# Patient Record
Sex: Female | Born: 1985 | Race: White | Hispanic: No | Marital: Married | Smoking: Never smoker
Health system: Southern US, Community
[De-identification: ages and names within clinical notes are randomized; demographics above are authoritative.]

## PROBLEM LIST (undated history)

## (undated) ENCOUNTER — Inpatient Hospital Stay (HOSPITAL_COMMUNITY): Payer: Self-pay

## (undated) DIAGNOSIS — E039 Hypothyroidism, unspecified: Secondary | ICD-10-CM

## (undated) DIAGNOSIS — Z8659 Personal history of other mental and behavioral disorders: Secondary | ICD-10-CM

## (undated) DIAGNOSIS — K5900 Constipation, unspecified: Secondary | ICD-10-CM

## (undated) DIAGNOSIS — F329 Major depressive disorder, single episode, unspecified: Secondary | ICD-10-CM

## (undated) DIAGNOSIS — Z862 Personal history of diseases of the blood and blood-forming organs and certain disorders involving the immune mechanism: Secondary | ICD-10-CM

## (undated) DIAGNOSIS — E559 Vitamin D deficiency, unspecified: Secondary | ICD-10-CM

## (undated) DIAGNOSIS — M2550 Pain in unspecified joint: Secondary | ICD-10-CM

## (undated) DIAGNOSIS — D649 Anemia, unspecified: Secondary | ICD-10-CM

## (undated) DIAGNOSIS — F429 Obsessive-compulsive disorder, unspecified: Secondary | ICD-10-CM

## (undated) DIAGNOSIS — C4490 Unspecified malignant neoplasm of skin, unspecified: Secondary | ICD-10-CM

## (undated) DIAGNOSIS — K219 Gastro-esophageal reflux disease without esophagitis: Secondary | ICD-10-CM

## (undated) DIAGNOSIS — F419 Anxiety disorder, unspecified: Secondary | ICD-10-CM

## (undated) DIAGNOSIS — Z6831 Body mass index (BMI) 31.0-31.9, adult: Secondary | ICD-10-CM

## (undated) DIAGNOSIS — G43009 Migraine without aura, not intractable, without status migrainosus: Secondary | ICD-10-CM

## (undated) DIAGNOSIS — Z91018 Allergy to other foods: Secondary | ICD-10-CM

## (undated) DIAGNOSIS — K9041 Non-celiac gluten sensitivity: Secondary | ICD-10-CM

## (undated) DIAGNOSIS — K5904 Chronic idiopathic constipation: Secondary | ICD-10-CM

## (undated) DIAGNOSIS — IMO0002 Reserved for concepts with insufficient information to code with codable children: Secondary | ICD-10-CM

## (undated) DIAGNOSIS — F32A Depression, unspecified: Secondary | ICD-10-CM

## (undated) DIAGNOSIS — M069 Rheumatoid arthritis, unspecified: Secondary | ICD-10-CM

## (undated) DIAGNOSIS — N921 Excessive and frequent menstruation with irregular cycle: Secondary | ICD-10-CM

## (undated) DIAGNOSIS — R630 Anorexia: Secondary | ICD-10-CM

## (undated) DIAGNOSIS — E063 Autoimmune thyroiditis: Secondary | ICD-10-CM

## (undated) DIAGNOSIS — F411 Generalized anxiety disorder: Secondary | ICD-10-CM

## (undated) DIAGNOSIS — L719 Rosacea, unspecified: Secondary | ICD-10-CM

## (undated) DIAGNOSIS — N920 Excessive and frequent menstruation with regular cycle: Secondary | ICD-10-CM

## (undated) HISTORY — DX: Vitamin D deficiency, unspecified: E55.9

## (undated) HISTORY — PX: WISDOM TOOTH EXTRACTION: SHX21

## (undated) HISTORY — PX: TONSILLECTOMY: SUR1361

## (undated) HISTORY — DX: Body mass index (BMI) 31.0-31.9, adult: Z68.31

## (undated) HISTORY — DX: Pain in unspecified joint: M25.50

## (undated) HISTORY — PX: SKIN BIOPSY: SHX1

## (undated) HISTORY — DX: Unspecified malignant neoplasm of skin, unspecified: C44.90

## (undated) HISTORY — DX: Anorexia: R63.0

## (undated) HISTORY — DX: Constipation, unspecified: K59.00

## (undated) HISTORY — DX: Reserved for concepts with insufficient information to code with codable children: IMO0002

## (undated) HISTORY — DX: Anemia, unspecified: D64.9

## (undated) HISTORY — DX: Allergy to other foods: Z91.018

---
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
## 2012-09-12 DIAGNOSIS — Z85820 Personal history of malignant melanoma of skin: Secondary | ICD-10-CM

## 2012-09-12 HISTORY — DX: Personal history of malignant melanoma of skin: Z85.820

## 2013-01-19 LAB — OB RESULTS CONSOLE RUBELLA ANTIBODY, IGM: Rubella: IMMUNE

## 2013-01-19 LAB — OB RESULTS CONSOLE ABO/RH

## 2013-01-19 LAB — OB RESULTS CONSOLE ANTIBODY SCREEN: Antibody Screen: NEGATIVE

## 2013-03-21 ENCOUNTER — Encounter (HOSPITAL_COMMUNITY): Payer: Self-pay

## 2013-03-21 ENCOUNTER — Inpatient Hospital Stay (HOSPITAL_COMMUNITY)
Admission: AD | Admit: 2013-03-21 | Discharge: 2013-03-21 | Disposition: A | Discharge status: Home / Self Care | Payer: BC Managed Care – PPO | Source: Ambulatory Visit | Attending: Obstetrics and Gynecology | Admitting: Obstetrics and Gynecology

## 2013-03-21 DIAGNOSIS — N949 Unspecified condition associated with female genital organs and menstrual cycle: Secondary | ICD-10-CM | POA: Insufficient documentation

## 2013-03-21 DIAGNOSIS — O99891 Other specified diseases and conditions complicating pregnancy: Secondary | ICD-10-CM | POA: Insufficient documentation

## 2013-03-21 HISTORY — DX: Anxiety disorder, unspecified: F41.9

## 2013-03-21 HISTORY — DX: Obsessive-compulsive disorder, unspecified: F42.9

## 2013-03-21 HISTORY — DX: Rheumatoid arthritis, unspecified: M06.9

## 2013-03-21 LAB — URINALYSIS, ROUTINE W REFLEX MICROSCOPIC
Leukocytes, UA: NEGATIVE
Nitrite: NEGATIVE
Specific Gravity, Urine: 1.015 (ref 1.005–1.030)
Urobilinogen, UA: 0.2 mg/dL (ref 0.0–1.0)
pH: 7.5 (ref 5.0–8.0)

## 2013-03-21 LAB — WET PREP, GENITAL
Clue Cells Wet Prep HPF POC: NONE SEEN
Trich, Wet Prep: NONE SEEN

## 2013-03-21 NOTE — MAU Provider Note (Signed)
History  Pt states last week noted a "piece of tissue," "flesh" colored when she voided 1 weeks ago and again last night in the bath. Denies pain/cramping or VB. Has u/s at 18 weeks, all wnl.    Chief Complaint  Patient presents with  . Skin cells in urine     OB History   Grav Para Term Preterm Abortions TAB SAB Ect Mult Living   1               Past Medical History  Diagnosis Date  . Anxiety   . OCD (obsessive compulsive disorder)   . Rheumatoid arthritis   . Fibromyalgia     Past Surgical History  Procedure Laterality Date  . Tonsillectomy    . Wisdom tooth extraction    . Skin biopsy      Left inner thigh    History reviewed. No pertinent family history.  History  Substance Use Topics  . Smoking status: Never Smoker   . Smokeless tobacco: Never Used  . Alcohol Use: No    Allergies:  Allergies  Allergen Reactions  . Pineapple Swelling    Lips/tongue swelling, doesn't not bother breathing  . Codeine Hives  . Darvocet (Propoxyphene-Acetaminophen) Hives  . Hydrocodone Hives  . Prednisone Hives and Rash    No prescriptions prior to admission    ROS - see HPI above, all other systems neg  Physical Exam   Blood pressure 117/62, pulse 79, temperature 97 F (36.1 C), temperature source Oral, resp. rate 18, height 5\' 4"  (1.626 m), weight 190 lb (86.183 kg).  Chest clear  Heart RRR without murmur  Abd gravid, NT  Pelvic--cervix posterior, C / soft / high;  No VB;  No tissue noted Ext WNL  FHR doppler 150   ED Course  IUP at [redacted]w[redacted]d  Wet prep - Neg UA - ketones 15 GC/CT pending   Addendum:  Pt requesting to leave before CNM could return to MAU Lab results rv'd with pt by RN RN to encourage pt to call if symptoms return or worsen.    Haroldine Laws CNM, MSN 03/21/2013 5:22 PM

## 2013-03-21 NOTE — MAU Note (Signed)
Pt states last week noted a "piece of tissue" when she voided. Denies pain or bleeding. Has u/s at 18 weeks, all wnl. Saw more tissue this week, and took bath last pm and noted more tissue. Concerned/anxious.

## 2013-03-21 NOTE — Progress Notes (Signed)
J. oxley cnm notified of wet prep results and patient desire to be discharged home. She states that she is unable to discharge patient at this time. She will come to see in MAU.

## 2013-05-17 LAB — OB RESULTS CONSOLE RPR: RPR: NONREACTIVE

## 2013-07-04 ENCOUNTER — Encounter (HOSPITAL_COMMUNITY): Payer: Self-pay | Admitting: *Deleted

## 2013-07-04 ENCOUNTER — Inpatient Hospital Stay (HOSPITAL_COMMUNITY)
Admission: AD | Admit: 2013-07-04 | Discharge: 2013-07-04 | Disposition: A | Discharge status: Home / Self Care | Payer: BC Managed Care – PPO | Source: Ambulatory Visit | Attending: Obstetrics and Gynecology | Admitting: Obstetrics and Gynecology

## 2013-07-04 DIAGNOSIS — O4700 False labor before 37 completed weeks of gestation, unspecified trimester: Secondary | ICD-10-CM | POA: Insufficient documentation

## 2013-07-04 DIAGNOSIS — Z8659 Personal history of other mental and behavioral disorders: Secondary | ICD-10-CM

## 2013-07-04 DIAGNOSIS — Z889 Allergy status to unspecified drugs, medicaments and biological substances status: Secondary | ICD-10-CM

## 2013-07-04 DIAGNOSIS — O99891 Other specified diseases and conditions complicating pregnancy: Secondary | ICD-10-CM | POA: Insufficient documentation

## 2013-07-04 DIAGNOSIS — F411 Generalized anxiety disorder: Secondary | ICD-10-CM | POA: Diagnosis not present

## 2013-07-04 DIAGNOSIS — M797 Fibromyalgia: Secondary | ICD-10-CM | POA: Diagnosis not present

## 2013-07-04 DIAGNOSIS — M069 Rheumatoid arthritis, unspecified: Secondary | ICD-10-CM | POA: Diagnosis not present

## 2013-07-04 DIAGNOSIS — F429 Obsessive-compulsive disorder, unspecified: Secondary | ICD-10-CM | POA: Diagnosis not present

## 2013-07-04 HISTORY — DX: Allergy status to unspecified drugs, medicaments and biological substances: Z88.9

## 2013-07-04 HISTORY — DX: Generalized anxiety disorder: F41.1

## 2013-07-04 HISTORY — DX: Personal history of other mental and behavioral disorders: Z86.59

## 2013-07-04 LAB — WET PREP, GENITAL
Trich, Wet Prep: NONE SEEN
Yeast Wet Prep HPF POC: NONE SEEN

## 2013-07-04 LAB — URINALYSIS, ROUTINE W REFLEX MICROSCOPIC
Bilirubin Urine: NEGATIVE
Hgb urine dipstick: NEGATIVE
Nitrite: NEGATIVE
Specific Gravity, Urine: 1.01 (ref 1.005–1.030)
pH: 6.5 (ref 5.0–8.0)

## 2013-07-04 LAB — OB RESULTS CONSOLE GC/CHLAMYDIA
Chlamydia: NEGATIVE
Gonorrhea: NEGATIVE

## 2013-07-04 NOTE — MAU Provider Note (Signed)
History   28 yo G1P0 at 8 2/7 weeks presented after calling office with c/o ? Leaking.  Some cramping today, denies bleeding, reports +FM.  Patient Active Problem List   Diagnosis Date Noted  . OCD (obsessive compulsive disorder) 07/04/2013  . Anxiety state, unspecified 07/04/2013  . Fibromyalgia 07/04/2013  . Hx of anorexia nervosa and bulimia 07/04/2013  . Rheumatoid arthritis 07/04/2013     Chief Complaint  Patient presents with  . Labor Eval  . Rupture of Membranes    OB History   Grav Para Term Preterm Abortions TAB SAB Ect Mult Living   1               Past Medical History  Diagnosis Date  . Anxiety   . OCD (obsessive compulsive disorder)   . Rheumatoid arthritis(714.0)   . Fibromyalgia     Past Surgical History  Procedure Laterality Date  . Tonsillectomy    . Wisdom tooth extraction    . Skin biopsy      Left inner thigh    History reviewed. No pertinent family history.  History  Substance Use Topics  . Smoking status: Never Smoker   . Smokeless tobacco: Never Used  . Alcohol Use: No    Allergies:  Allergies  Allergen Reactions  . Pineapple Swelling    Lips/tongue swelling, doesn't not bother breathing  . Codeine Hives  . Darvocet (Propoxyphene-Acetaminophen) Hives  . Hydrocodone Hives  . Methotrexate Derivatives Hives and Rash  . Prednisone Hives and Rash    No prescriptions prior to admission    Physical Exam   Blood pressure 120/67, pulse 104, temperature 97.9 F (36.6 C), temperature source Oral, resp. rate 18.  Chest clear Heart RRR without murmur Abd gravid, NT Pelvic--small amount thin white d/c in vault. Cervix closed and long, vtx, -2 Ext WNL  FHR Category 1 UCs irregular and sporadic, mild  Results for orders placed during the hospital encounter of 07/04/13 (from the past 24 hour(s))  URINALYSIS, ROUTINE W REFLEX MICROSCOPIC     Status: None   Collection Time    07/04/13  3:47 PM      Result Value Range   Color,  Urine YELLOW  YELLOW   APPearance CLEAR  CLEAR   Specific Gravity, Urine 1.010  1.005 - 1.030   pH 6.5  5.0 - 8.0   Glucose, UA NEGATIVE  NEGATIVE mg/dL   Hgb urine dipstick NEGATIVE  NEGATIVE   Bilirubin Urine NEGATIVE  NEGATIVE   Ketones, ur NEGATIVE  NEGATIVE mg/dL   Protein, ur NEGATIVE  NEGATIVE mg/dL   Urobilinogen, UA 0.2  0.0 - 1.0 mg/dL   Nitrite NEGATIVE  NEGATIVE   Leukocytes, UA NEGATIVE  NEGATIVE  AMNISURE RUPTURE OF MEMBRANE (ROM)     Status: None   Collection Time    07/04/13  4:25 PM      Result Value Range   Amnisure ROM NEGATIVE    WET PREP, GENITAL     Status: Abnormal   Collection Time    07/04/13  5:00 PM      Result Value Range   Yeast Wet Prep HPF POC NONE SEEN  NONE SEEN   Trich, Wet Prep NONE SEEN  NONE SEEN   Clue Cells Wet Prep HPF POC NONE SEEN  NONE SEEN   WBC, Wet Prep HPF POC FEW (*) NONE SEEN     ED Course  IUP at 34 2/7 weeks No evidence SROM or PTL D/C'd home with  PTL precautions. OOW tomorrow--encourage to push fluids and rest. Keep scheduled appt on Monday or call prn.  Nigel Bridgeman CNM, [place] 07/04/2013 9:12 PM

## 2013-07-04 NOTE — MAU Note (Signed)
Pt presents with complaints of a large gush of clear fluid at 1030 this am. States some contractions started after she felt that gush of fluid. Denies any bleeding. States baby is active.

## 2013-07-05 LAB — GC/CHLAMYDIA PROBE AMP
CT Probe RNA: NEGATIVE
GC Probe RNA: NEGATIVE

## 2013-07-07 LAB — CULTURE, BETA STREP (GROUP B ONLY)

## 2013-08-16 ENCOUNTER — Telehealth (HOSPITAL_COMMUNITY): Payer: Self-pay | Admitting: *Deleted

## 2013-08-16 ENCOUNTER — Encounter (HOSPITAL_COMMUNITY): Payer: Self-pay

## 2013-08-16 ENCOUNTER — Encounter (HOSPITAL_COMMUNITY): Payer: Self-pay | Admitting: *Deleted

## 2013-08-16 ENCOUNTER — Inpatient Hospital Stay (HOSPITAL_COMMUNITY)
Admission: RE | Admit: 2013-08-16 | Discharge: 2013-08-19 | DRG: 370 | Disposition: A | Discharge status: Home / Self Care | Payer: BC Managed Care – PPO | Source: Ambulatory Visit | Attending: Obstetrics and Gynecology | Admitting: Obstetrics and Gynecology

## 2013-08-16 DIAGNOSIS — O3660X0 Maternal care for excessive fetal growth, unspecified trimester, not applicable or unspecified: Secondary | ICD-10-CM | POA: Diagnosis present

## 2013-08-16 DIAGNOSIS — F411 Generalized anxiety disorder: Secondary | ICD-10-CM | POA: Diagnosis present

## 2013-08-16 DIAGNOSIS — O324XX0 Maternal care for high head at term, not applicable or unspecified: Secondary | ICD-10-CM | POA: Diagnosis present

## 2013-08-16 DIAGNOSIS — Z889 Allergy status to unspecified drugs, medicaments and biological substances status: Secondary | ICD-10-CM

## 2013-08-16 DIAGNOSIS — Z98891 History of uterine scar from previous surgery: Secondary | ICD-10-CM

## 2013-08-16 DIAGNOSIS — IMO0002 Reserved for concepts with insufficient information to code with codable children: Secondary | ICD-10-CM | POA: Diagnosis present

## 2013-08-16 DIAGNOSIS — D649 Anemia, unspecified: Secondary | ICD-10-CM | POA: Diagnosis present

## 2013-08-16 DIAGNOSIS — F410 Panic disorder [episodic paroxysmal anxiety] without agoraphobia: Secondary | ICD-10-CM | POA: Diagnosis present

## 2013-08-16 DIAGNOSIS — O9902 Anemia complicating childbirth: Secondary | ICD-10-CM | POA: Diagnosis present

## 2013-08-16 DIAGNOSIS — F429 Obsessive-compulsive disorder, unspecified: Secondary | ICD-10-CM | POA: Diagnosis present

## 2013-08-16 DIAGNOSIS — O480 Post-term pregnancy: Principal | ICD-10-CM | POA: Diagnosis present

## 2013-08-16 LAB — CBC
HCT: 32.6 % — ABNORMAL LOW (ref 36.0–46.0)
Hemoglobin: 10.8 g/dL — ABNORMAL LOW (ref 12.0–15.0)
RDW: 15 % (ref 11.5–15.5)
WBC: 10.3 10*3/uL (ref 4.0–10.5)

## 2013-08-16 MED ORDER — TERBUTALINE SULFATE 1 MG/ML IJ SOLN: 0.2500 mg | Freq: Once | INTRAMUSCULAR | Status: AC | PRN

## 2013-08-16 MED ORDER — SERTRALINE HCL 25 MG PO TABS
25.0000 mg | ORAL_TABLET | Freq: Every day | ORAL | Status: DC
  Administered 2013-08-16 – 2013-08-18 (×3): 25 mg via ORAL
  Filled 2013-08-16 (×4): qty 1

## 2013-08-16 MED ORDER — LACTATED RINGERS IV SOLN
INTRAVENOUS | Status: DC
  Administered 2013-08-17: 08:00:00 via INTRAVENOUS

## 2013-08-16 MED ORDER — OXYTOCIN BOLUS FROM INFUSION: 500.0000 mL | INTRAVENOUS | Status: DC

## 2013-08-16 MED ORDER — LIDOCAINE HCL (PF) 1 % IJ SOLN
30.0000 mL | INTRAMUSCULAR | Status: DC | PRN
  Filled 2013-08-16: qty 30

## 2013-08-16 MED ORDER — LACTATED RINGERS IV SOLN: 500.0000 mL | INTRAVENOUS | Status: DC | PRN

## 2013-08-16 MED ORDER — MISOPROSTOL 25 MCG QUARTER TABLET
25.0000 ug | ORAL_TABLET | ORAL | Status: DC | PRN
  Administered 2013-08-16: 25 ug via VAGINAL
  Filled 2013-08-16: qty 0.25

## 2013-08-16 MED ORDER — OXYTOCIN 40 UNITS IN LACTATED RINGERS INFUSION - SIMPLE MED
62.5000 mL/h | INTRAVENOUS | Status: DC
  Filled 2013-08-16: qty 1000

## 2013-08-16 MED ORDER — IBUPROFEN 600 MG PO TABS: 600.0000 mg | ORAL_TABLET | Freq: Four times a day (QID) | ORAL | Status: DC | PRN

## 2013-08-16 MED ORDER — CITRIC ACID-SODIUM CITRATE 334-500 MG/5ML PO SOLN
30.0000 mL | ORAL | Status: DC | PRN
  Administered 2013-08-17: 30 mL via ORAL
  Filled 2013-08-16: qty 15

## 2013-08-16 MED ORDER — ONDANSETRON HCL 4 MG/2ML IJ SOLN: 4.0000 mg | Freq: Four times a day (QID) | INTRAMUSCULAR | Status: DC | PRN

## 2013-08-16 NOTE — Telephone Encounter (Signed)
Preadmission screen  
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
## 2013-08-17 ENCOUNTER — Encounter (HOSPITAL_COMMUNITY): Payer: Self-pay | Admitting: Anesthesiology

## 2013-08-17 ENCOUNTER — Encounter (HOSPITAL_COMMUNITY): Payer: Self-pay

## 2013-08-17 ENCOUNTER — Inpatient Hospital Stay (HOSPITAL_COMMUNITY): Admission: RE | Admit: 2013-08-17 | Payer: BC Managed Care – PPO | Source: Ambulatory Visit

## 2013-08-17 ENCOUNTER — Inpatient Hospital Stay (HOSPITAL_COMMUNITY): Payer: BC Managed Care – PPO | Admitting: Anesthesiology

## 2013-08-17 ENCOUNTER — Encounter (HOSPITAL_COMMUNITY)
Admission: RE | Disposition: A | Discharge status: Home / Self Care | Payer: Self-pay | Source: Ambulatory Visit | Attending: Obstetrics and Gynecology

## 2013-08-17 DIAGNOSIS — D649 Anemia, unspecified: Secondary | ICD-10-CM | POA: Diagnosis present

## 2013-08-17 LAB — TYPE AND SCREEN
ABO/RH(D): A POS
Antibody Screen: NEGATIVE

## 2013-08-17 LAB — ABO/RH: ABO/RH(D): A POS

## 2013-08-17 LAB — RPR: RPR Ser Ql: NONREACTIVE

## 2013-08-17 SURGERY — Surgical Case
Anesthesia: Epidural | Site: Abdomen | Wound class: Clean Contaminated

## 2013-08-17 MED ORDER — LANOLIN HYDROUS EX OINT: 1.0000 "application " | TOPICAL_OINTMENT | CUTANEOUS | Status: DC | PRN

## 2013-08-17 MED ORDER — SIMETHICONE 80 MG PO CHEW
80.0000 mg | CHEWABLE_TABLET | Freq: Three times a day (TID) | ORAL | Status: DC
  Administered 2013-08-17 – 2013-08-19 (×6): 80 mg via ORAL

## 2013-08-17 MED ORDER — MORPHINE SULFATE 0.5 MG/ML IJ SOLN
INTRAMUSCULAR | Status: AC
  Filled 2013-08-17: qty 10

## 2013-08-17 MED ORDER — ZOLPIDEM TARTRATE 5 MG PO TABS: 5.0000 mg | ORAL_TABLET | Freq: Every evening | ORAL | Status: DC | PRN

## 2013-08-17 MED ORDER — ONDANSETRON HCL 4 MG/2ML IJ SOLN
INTRAMUSCULAR | Status: AC
  Filled 2013-08-17: qty 2

## 2013-08-17 MED ORDER — ONDANSETRON HCL 4 MG/2ML IJ SOLN: 4.0000 mg | Freq: Three times a day (TID) | INTRAMUSCULAR | Status: DC | PRN

## 2013-08-17 MED ORDER — HYDROMORPHONE HCL 2 MG PO TABS: 2.0000 mg | ORAL_TABLET | ORAL | Status: DC | PRN

## 2013-08-17 MED ORDER — FENTANYL 2.5 MCG/ML BUPIVACAINE 1/10 % EPIDURAL INFUSION (WH - ANES)
14.0000 mL/h | INTRAMUSCULAR | Status: DC | PRN
  Administered 2013-08-17 (×2): 14 mL/h via EPIDURAL
  Filled 2013-08-17 (×2): qty 125

## 2013-08-17 MED ORDER — CEFAZOLIN SODIUM-DEXTROSE 2-3 GM-% IV SOLR
INTRAVENOUS | Status: DC | PRN
  Administered 2013-08-17: 2 g via INTRAVENOUS

## 2013-08-17 MED ORDER — BUPIVACAINE HCL (PF) 0.25 % IJ SOLN
INTRAMUSCULAR | Status: DC | PRN
  Administered 2013-08-17: 20 mL
  Administered 2013-08-17: 10 mL

## 2013-08-17 MED ORDER — PHENYLEPHRINE 40 MCG/ML (10ML) SYRINGE FOR IV PUSH (FOR BLOOD PRESSURE SUPPORT): 80.0000 ug | PREFILLED_SYRINGE | INTRAVENOUS | Status: DC | PRN

## 2013-08-17 MED ORDER — PHENYLEPHRINE HCL 10 MG/ML IJ SOLN
INTRAMUSCULAR | Status: DC | PRN
  Administered 2013-08-17 (×2): 40 ug via INTRAVENOUS

## 2013-08-17 MED ORDER — ONDANSETRON HCL 4 MG/2ML IJ SOLN
INTRAMUSCULAR | Status: DC | PRN
  Administered 2013-08-17: 4 mg via INTRAVENOUS

## 2013-08-17 MED ORDER — SCOPOLAMINE 1 MG/3DAYS TD PT72
MEDICATED_PATCH | TRANSDERMAL | Status: AC
  Administered 2013-08-17: 1.5 mg via TRANSDERMAL
  Filled 2013-08-17: qty 1

## 2013-08-17 MED ORDER — DIPHENHYDRAMINE HCL 25 MG PO CAPS: 25.0000 mg | ORAL_CAPSULE | ORAL | Status: DC | PRN

## 2013-08-17 MED ORDER — IBUPROFEN 600 MG PO TABS
600.0000 mg | ORAL_TABLET | Freq: Four times a day (QID) | ORAL | Status: DC
  Administered 2013-08-18 – 2013-08-19 (×7): 600 mg via ORAL
  Filled 2013-08-17 (×6): qty 1

## 2013-08-17 MED ORDER — LACTATED RINGERS IV SOLN
500.0000 mL | Freq: Once | INTRAVENOUS | Status: AC
  Administered 2013-08-17: 500 mL via INTRAVENOUS

## 2013-08-17 MED ORDER — NALOXONE HCL 1 MG/ML IJ SOLN: 1.0000 ug/kg/h | INTRAVENOUS | Status: DC | PRN

## 2013-08-17 MED ORDER — LIDOCAINE HCL (PF) 1 % IJ SOLN
INTRAMUSCULAR | Status: DC | PRN
  Administered 2013-08-17 (×2): 5 mL

## 2013-08-17 MED ORDER — PHENYLEPHRINE 40 MCG/ML (10ML) SYRINGE FOR IV PUSH (FOR BLOOD PRESSURE SUPPORT)
80.0000 ug | PREFILLED_SYRINGE | INTRAVENOUS | Status: DC | PRN
  Filled 2013-08-17: qty 5

## 2013-08-17 MED ORDER — MENTHOL 3 MG MT LOZG: 1.0000 | LOZENGE | OROMUCOSAL | Status: DC | PRN

## 2013-08-17 MED ORDER — DIPHENHYDRAMINE HCL 50 MG/ML IJ SOLN: 12.5000 mg | INTRAMUSCULAR | Status: DC | PRN

## 2013-08-17 MED ORDER — DIBUCAINE 1 % RE OINT: 1.0000 "application " | TOPICAL_OINTMENT | RECTAL | Status: DC | PRN

## 2013-08-17 MED ORDER — NALOXONE HCL 0.4 MG/ML IJ SOLN: 0.4000 mg | INTRAMUSCULAR | Status: DC | PRN

## 2013-08-17 MED ORDER — EPHEDRINE 5 MG/ML INJ
10.0000 mg | INTRAVENOUS | Status: DC | PRN
  Filled 2013-08-17: qty 4

## 2013-08-17 MED ORDER — MORPHINE SULFATE (PF) 0.5 MG/ML IJ SOLN
INTRAMUSCULAR | Status: DC | PRN
  Administered 2013-08-17: 4 mg via EPIDURAL

## 2013-08-17 MED ORDER — LACTATED RINGERS IV SOLN
INTRAVENOUS | Status: DC | PRN
  Administered 2013-08-17: 13:00:00 via INTRAVENOUS

## 2013-08-17 MED ORDER — LIDOCAINE-EPINEPHRINE (PF) 2 %-1:200000 IJ SOLN
INTRAMUSCULAR | Status: AC
  Filled 2013-08-17: qty 20

## 2013-08-17 MED ORDER — DIPHENHYDRAMINE HCL 25 MG PO CAPS: 25.0000 mg | ORAL_CAPSULE | Freq: Four times a day (QID) | ORAL | Status: DC | PRN

## 2013-08-17 MED ORDER — OXYTOCIN 40 UNITS IN LACTATED RINGERS INFUSION - SIMPLE MED: 62.5000 mL/h | INTRAVENOUS | Status: AC

## 2013-08-17 MED ORDER — OXYTOCIN 10 UNIT/ML IJ SOLN
INTRAMUSCULAR | Status: AC
  Filled 2013-08-17: qty 4

## 2013-08-17 MED ORDER — SODIUM BICARBONATE 8.4 % IV SOLN
INTRAVENOUS | Status: AC
  Filled 2013-08-17: qty 50

## 2013-08-17 MED ORDER — MEPERIDINE HCL 25 MG/ML IJ SOLN
INTRAMUSCULAR | Status: AC
  Administered 2013-08-17: 6.25 mg via INTRAVENOUS
  Filled 2013-08-17: qty 1

## 2013-08-17 MED ORDER — EPHEDRINE 5 MG/ML INJ: 10.0000 mg | INTRAVENOUS | Status: DC | PRN

## 2013-08-17 MED ORDER — TETANUS-DIPHTH-ACELL PERTUSSIS 5-2.5-18.5 LF-MCG/0.5 IM SUSP: 0.5000 mL | Freq: Once | INTRAMUSCULAR | Status: DC

## 2013-08-17 MED ORDER — KETOROLAC TROMETHAMINE 30 MG/ML IJ SOLN: 30.0000 mg | Freq: Four times a day (QID) | INTRAMUSCULAR | Status: DC | PRN

## 2013-08-17 MED ORDER — ONDANSETRON HCL 4 MG/2ML IJ SOLN: 4.0000 mg | INTRAMUSCULAR | Status: DC | PRN

## 2013-08-17 MED ORDER — PRENATAL MULTIVITAMIN CH
1.0000 | ORAL_TABLET | Freq: Every day | ORAL | Status: DC
  Administered 2013-08-18 – 2013-08-19 (×2): 1 via ORAL
  Filled 2013-08-17 (×2): qty 1

## 2013-08-17 MED ORDER — KETOROLAC TROMETHAMINE 30 MG/ML IJ SOLN
INTRAMUSCULAR | Status: AC
  Administered 2013-08-17: 30 mg via INTRAMUSCULAR
  Filled 2013-08-17: qty 1

## 2013-08-17 MED ORDER — SIMETHICONE 80 MG PO CHEW: 80.0000 mg | CHEWABLE_TABLET | ORAL | Status: DC | PRN

## 2013-08-17 MED ORDER — MEPERIDINE HCL 25 MG/ML IJ SOLN
6.2500 mg | INTRAMUSCULAR | Status: DC | PRN
  Administered 2013-08-17: 6.25 mg via INTRAVENOUS

## 2013-08-17 MED ORDER — LACTATED RINGERS IV SOLN
INTRAVENOUS | Status: DC
  Administered 2013-08-18: 02:00:00 via INTRAVENOUS

## 2013-08-17 MED ORDER — FENTANYL CITRATE 0.05 MG/ML IJ SOLN
100.0000 ug | INTRAMUSCULAR | Status: DC | PRN
  Administered 2013-08-17: 100 ug via INTRAVENOUS
  Filled 2013-08-17: qty 2

## 2013-08-17 MED ORDER — METOCLOPRAMIDE HCL 5 MG/ML IJ SOLN: 10.0000 mg | Freq: Three times a day (TID) | INTRAMUSCULAR | Status: DC | PRN

## 2013-08-17 MED ORDER — MEDROXYPROGESTERONE ACETATE 150 MG/ML IM SUSP: 150.0000 mg | INTRAMUSCULAR | Status: DC | PRN

## 2013-08-17 MED ORDER — DIPHENHYDRAMINE HCL 50 MG/ML IJ SOLN: 25.0000 mg | INTRAMUSCULAR | Status: DC | PRN

## 2013-08-17 MED ORDER — OXYTOCIN 10 UNIT/ML IJ SOLN
40.0000 [IU] | INTRAVENOUS | Status: DC | PRN
  Administered 2013-08-17: 40 [IU] via INTRAVENOUS

## 2013-08-17 MED ORDER — SCOPOLAMINE 1 MG/3DAYS TD PT72
1.0000 | MEDICATED_PATCH | Freq: Once | TRANSDERMAL | Status: DC
  Administered 2013-08-17: 1.5 mg via TRANSDERMAL

## 2013-08-17 MED ORDER — FERROUS SULFATE 325 (65 FE) MG PO TABS
325.0000 mg | ORAL_TABLET | Freq: Two times a day (BID) | ORAL | Status: DC
  Administered 2013-08-18: 325 mg via ORAL
  Filled 2013-08-17 (×3): qty 1

## 2013-08-17 MED ORDER — KETOROLAC TROMETHAMINE 30 MG/ML IJ SOLN
30.0000 mg | Freq: Four times a day (QID) | INTRAMUSCULAR | Status: DC | PRN
  Administered 2013-08-17: 30 mg via INTRAMUSCULAR

## 2013-08-17 MED ORDER — FENTANYL CITRATE 0.05 MG/ML IJ SOLN: 25.0000 ug | INTRAMUSCULAR | Status: DC | PRN

## 2013-08-17 MED ORDER — CEFAZOLIN SODIUM-DEXTROSE 2-3 GM-% IV SOLR
INTRAVENOUS | Status: AC
  Filled 2013-08-17: qty 50

## 2013-08-17 MED ORDER — ONDANSETRON HCL 4 MG PO TABS: 4.0000 mg | ORAL_TABLET | ORAL | Status: DC | PRN

## 2013-08-17 MED ORDER — SODIUM CHLORIDE 0.9 % IJ SOLN: 3.0000 mL | INTRAMUSCULAR | Status: DC | PRN

## 2013-08-17 MED ORDER — WITCH HAZEL-GLYCERIN EX PADS: 1.0000 "application " | MEDICATED_PAD | CUTANEOUS | Status: DC | PRN

## 2013-08-17 MED ORDER — SODIUM BICARBONATE 8.4 % IV SOLN
INTRAVENOUS | Status: DC | PRN
  Administered 2013-08-17: 4 mL via EPIDURAL
  Administered 2013-08-17 (×2): 5 mL via EPIDURAL

## 2013-08-17 MED ORDER — BUPIVACAINE HCL (PF) 0.25 % IJ SOLN
INTRAMUSCULAR | Status: AC
  Filled 2013-08-17: qty 30

## 2013-08-17 MED ORDER — SENNOSIDES-DOCUSATE SODIUM 8.6-50 MG PO TABS
2.0000 | ORAL_TABLET | Freq: Every day | ORAL | Status: DC
  Administered 2013-08-17 – 2013-08-18 (×2): 2 via ORAL

## 2013-08-17 SURGICAL SUPPLY — 45 items
BENZOIN TINCTURE PRP APPL 2/3 (GAUZE/BANDAGES/DRESSINGS) IMPLANT
BLADE EXTENDED COATED 6.5IN (ELECTRODE) IMPLANT
BOOTIES KNEE HIGH SLOAN (MISCELLANEOUS) ×4 IMPLANT
CLAMP CORD UMBIL (MISCELLANEOUS) IMPLANT
CLOTH BEACON ORANGE TIMEOUT ST (SAFETY) ×2 IMPLANT
CONTAINER PREFILL 10% NBF 15ML (MISCELLANEOUS) ×4 IMPLANT
DERMABOND ADVANCED (GAUZE/BANDAGES/DRESSINGS)
DERMABOND ADVANCED .7 DNX12 (GAUZE/BANDAGES/DRESSINGS) IMPLANT
DRAIN JACKSON PRT FLT 7MM (DRAIN) IMPLANT
DRAPE LG THREE QUARTER DISP (DRAPES) ×2 IMPLANT
DRSG OPSITE POSTOP 4X10 (GAUZE/BANDAGES/DRESSINGS) ×2 IMPLANT
DURAPREP 26ML APPLICATOR (WOUND CARE) ×2 IMPLANT
ELECT CAUTERY BLADE 6.4 (BLADE) IMPLANT
ELECT REM PT RETURN 9FT ADLT (ELECTROSURGICAL) ×2
ELECTRODE REM PT RTRN 9FT ADLT (ELECTROSURGICAL) ×1 IMPLANT
EVACUATOR SILICONE 100CC (DRAIN) IMPLANT
EXTRACTOR VACUUM KIWI (MISCELLANEOUS) IMPLANT
EXTRACTOR VACUUM M CUP 4 TUBE (SUCTIONS) IMPLANT
GLOVE SURG SS PI 6.5 STRL IVOR (GLOVE) ×4 IMPLANT
GOWN PREVENTION PLUS XLARGE (GOWN DISPOSABLE) ×2 IMPLANT
GOWN STRL REIN XL XLG (GOWN DISPOSABLE) ×2 IMPLANT
KIT ABG SYR 3ML LUER SLIP (SYRINGE) IMPLANT
NEEDLE HYPO 25X5/8 SAFETYGLIDE (NEEDLE) ×2 IMPLANT
NEEDLE SPNL 22GX3.5 QUINCKE BK (NEEDLE) ×2 IMPLANT
NS IRRIG 1000ML POUR BTL (IV SOLUTION) ×2 IMPLANT
PACK C SECTION WH (CUSTOM PROCEDURE TRAY) ×2 IMPLANT
PAD OB MATERNITY 4.3X12.25 (PERSONAL CARE ITEMS) ×2 IMPLANT
RETRACTOR WND ALEXIS 25 LRG (MISCELLANEOUS) ×1 IMPLANT
RTRCTR WOUND ALEXIS 25CM LRG (MISCELLANEOUS) ×2
STRIP CLOSURE SKIN 1/4X4 (GAUZE/BANDAGES/DRESSINGS) IMPLANT
SUT CHROMIC 2 0 SH (SUTURE) ×2 IMPLANT
SUT MNCRL AB 3-0 PS2 27 (SUTURE) ×2 IMPLANT
SUT SILK 0 FSL (SUTURE) IMPLANT
SUT VIC AB 0 CT1 27 (SUTURE) ×2
SUT VIC AB 0 CT1 27XBRD ANBCTR (SUTURE) ×2 IMPLANT
SUT VIC AB 0 CT1 36 (SUTURE) IMPLANT
SUT VIC AB 0 CTXB 36 (SUTURE) IMPLANT
SUT VIC AB 2-0 CT1 27 (SUTURE) ×2
SUT VIC AB 2-0 CT1 TAPERPNT 27 (SUTURE) ×2 IMPLANT
SUT VIC AB 2-0 SH 27 (SUTURE)
SUT VIC AB 2-0 SH 27XBRD (SUTURE) IMPLANT
SYR CONTROL 10ML LL (SYRINGE) ×2 IMPLANT
TOWEL OR 17X24 6PK STRL BLUE (TOWEL DISPOSABLE) ×2 IMPLANT
TRAY FOLEY CATH 14FR (SET/KITS/TRAYS/PACK) IMPLANT
WATER STERILE IRR 1000ML POUR (IV SOLUTION) IMPLANT

## 2013-08-17 NOTE — Progress Notes (Addendum)
  Subjective: Called to BS, because pt wants epidural but wants to know that she is changing her cervix first.  Pt sitting up at side of bed, breathing through UCs.   Objective: BP 117/83  Pulse 95  Temp(Src) 98.4 F (36.9 C) (Axillary)  Resp 20  Ht 5\' 4"  (1.626 m)  Wt 210 lb (95.255 kg)  BMI 36.03 kg/m2  SpO2 99%      FHT:  Cat I UC:   regular, every 2-4 minutes  SVE:   Dilation: 3 Effacement (%): 90 Station: -2 Exam by:: Alison Kelly CNM  Assessment / Plan:  Labor: Active labor after 1 dose of Cytotec at 2131 Preeclampsia: no s/s Fetal Wellbeing: Cat I Pain Control: 1 dose of Fentanyl at 0211; Requesting epidural I/D: GBS neg; intact; afibrile Anticipated MOD: SVD   Alison Kelly 08/17/2013, 4:08 AM

## 2013-08-17 NOTE — Progress Notes (Signed)
Alison Kelly is a 27 y.o. G1P0 at [redacted]w[redacted]d by LMP admitted for induction of labor due to Post dates. Due date 08/09/13 per office records. Pt was also noted to have [id number] GM.  Subjective:   Pt requesting cesarean section.  She has been completely dilated for 3.5 hours.  She has pushed for almost 3 hours. She declines attempt at vacuum assisted vaginal delivery   Objective: BP 121/72  Pulse 93  Temp(Src) 98.2 F (36.8 C) (Oral)  Resp 20  Ht 5\' 4"  (1.626 m)  Wt 210 lb (95.255 kg)  BMI 36.03 kg/m2  SpO2 100%   Total I/O In: -  Out: 250 [Urine:250]  FHT:  FHR: 120-130 bpm, variability: moderate,  accelerations:  Present,  decelerations:  Present variables UC:   regular, every 2 minutes SVE:   Dilation: 10 Effacement (%): 100 Station: +2 Exam by:: Laurel Dimmer MD  Labs: Lab Results  Component Value Date   WBC 10.3 08/16/2013   HGB 10.8* 08/16/2013   HCT 32.6* 08/16/2013   MCV 84.0 08/16/2013   PLT 207 08/16/2013    Assessment / Plan: Arrest of decent  Labor: Adequate contractions Preeclampsia:  no signs or symptoms of toxicity Fetal Wellbeing:  Category I Pain Control:  Epidural I/D:  n/a Anticipated MOD:  Pt requestst cesarean. the options for vaginal delivery, including continued pushing and vacuum assisted vaginal delivery were declined. The indication of prolonged second stage for cesarean section was reviewed. The benefit of rapid delivery was reviewed as were the risks of anesthesia, bleeding, infection, damage to adjacent organs, and possible need for subsequent cesarean sections in subsequent pregnancies. The patient expressed a desire to proceed with cesarean section.  Alison Kelly P 08/17/2013, 12:57 PM

## 2013-08-17 NOTE — Anesthesia Procedure Notes (Signed)
Epidural Patient location during procedure: OB Start time: 08/17/2013 4:29 AM  Staffing Anesthesiologist: Angus Seller., Harrell Gave. Performed by: anesthesiologist   Preanesthetic Checklist Completed: patient identified, site marked, surgical consent, pre-op evaluation, timeout performed, IV checked, risks and benefits discussed and monitors and equipment checked  Epidural Patient position: sitting Prep: site prepped and draped and DuraPrep Patient monitoring: continuous pulse ox and blood pressure Approach: midline Injection technique: LOR air and LOR saline  Needle:  Needle type: Tuohy  Needle gauge: 17 G Needle length: 9 cm and 9 Needle insertion depth: 5 cm cm Catheter type: closed end flexible Catheter size: 19 Gauge Catheter at skin depth: 10 cm Test dose: negative  Assessment Events: blood not aspirated, injection not painful, no injection resistance, negative IV test and no paresthesia  Additional Notes Patient identified.  Risk benefits discussed including failed block, incomplete pain control, headache, nerve damage, paralysis, blood pressure changes, nausea, vomiting, reactions to medication both toxic or allergic, and postpartum back pain.  Patient expressed understanding and wished to proceed.  All questions were answered.  Sterile technique used throughout procedure and epidural site dressed with sterile barrier dressing. No paresthesia or other complications noted.The patient did not experience any signs of intravascular injection such as tinnitus or metallic taste in mouth nor signs of intrathecal spread such as rapid motor block. Please see nursing notes for vital signs.

## 2013-08-17 NOTE — Anesthesia Postprocedure Evaluation (Signed)
  Anesthesia Post-op Note  Patient: Alison Kelly  Procedure(s) Performed: Procedure(s): CESAREAN SECTION (N/A)  Patient Location: Mother/Baby  Anesthesia Type:Epidural  Level of Consciousness: awake, alert  and oriented  Airway and Oxygen Therapy: Patient Spontanous Breathing  Post-op Pain: mild  Post-op Assessment: Post-op Vital signs reviewed, Patient's Cardiovascular Status Stable, No headache, No backache, No residual numbness and No residual motor weakness  Post-op Vital Signs: Reviewed and stable  Complications: No apparent anesthesia complications

## 2013-08-17 NOTE — Op Note (Signed)
Cesarean Section Procedure Note  Indications: failure to progress: arrest of descent  Pre-operative Diagnosis: 41 week 1 day pregnancy.                                               Concern for fetal macrosomia with an estimated fetal weight of 4300 g                                               Failure to progress with arrest of descent   Post-operative Diagnosis: same  Surgeon: Hal Morales  First Assistant:  Surgeon: Hal Morales   Assistants: None  Anesthesia: Epidural anesthesia  ASA Class: 2  Procedure Details  The patient was seen in the Holding Room. The risks, benefits, complications, treatment options, and expected outcomes were discussed with the patient.  The patient concurred with the proposed plan, giving informed consent.  The site of surgery properly noted/marked. The patient was taken to Operating Room # 2, identified as Alison Kelly and the procedure verified as C-Section Delivery. A Time Out was held and the above information confirmed.  After induction of anesthesia, the abdomen  was  prepped with chlor prep  in the usual sterile manner.A foley catheter was in place from labor.  The patient was then draped in the usual fashion.   Suprapubicsubcutaneous injection of 0.25% Bupivacaine   A Pfannenstiel incision was made and carried down through the subcutaneous tissue to the fascia. Fascial incision was made and extended transversely. The fascia was separated from the underlying rectus tissue superiorly and inferiorly. The peritoneum was identified and entered. Peritoneal incision was extended longitudinally.  an Teacher, early years/pre  was placed.   A low transverse uterine incision was made two cm above the uterovesical fold, and that incision extended transversely bluntly.The infant was  delivered from occiput anterior  presentation was a  living female infant with Apgar scores of 9 at one minute and 9 at five minutes. After the umbilical cord was clamped and  cut, the  placenta was removed intact and appeared normal. it was handed off to the representatives of [facility]  cord blood Bank   The uterine outline, tubes and ovaries appeared norma for the gravid state. The uterine incision was closed with running locked sutures of 0 Vicryl. An imbricating layer of sutures was placed. Hemostasis was observed. Lavage was carried out until clear. The peritoneum was closed with a running suture of 2-0 Vicryl.  The rectus muscles were reapproximated with a figure of 8 suture of 2-0 Vicryl.  The fascia was then reapproximated with a running sutures of 0 Vicryl .Renforcing figure of 8 sutures of 0Vicryl were placed on either side of midline.   The skin was reapproximated with3-0 moncryl.  A sterile dressing was applied. The infant remained in the operating room to begin skin to skin bonding.   Instrument, sponge, and needle counts were correct prior to the abdominal closure and at the conclusion of the case.   Findings:  Placenta contained a 3vessel cord    Estimated Blood Loss:  500cc         Drains:  none          Total IV Fluids:   Specimens:  placenta         Implants:  none          Complications: ::"None; patient tolerated the procedure well."         Disposition: PACU - hemodynamically stable.         Condition: stable  Attending Attestation: I performed the procedure.  HAYGOOD,VANESSA P  08/17/2013 2:15 PM

## 2013-08-17 NOTE — Progress Notes (Signed)
Attempt to in/out cath unsuccessful due to low fetal station - will attempt bed pan void CHayes, RNC

## 2013-08-17 NOTE — Progress Notes (Addendum)
  Subjective: Pt reporting mild UCs have started.  Discussed holding off on the next dose of Cytotec to see if her body is in labor on its own.  Pt agreeable.  Objective: BP 117/83  Pulse 95  Temp(Src) 98.4 F (36.9 C) (Axillary)  Resp 20  Ht 5\' 4"  (1.626 m)  Wt 210 lb (95.255 kg)  BMI 36.03 kg/m2  SpO2 99%      FHT:  Cat I UC:   regular, every 1-6 minutes  SVE:   0.5 / 80 / -2  Assessment / Plan:  Labor: ? Early labor; 1 dose of Cytotec at 2131 Preeclampsia: No s/s Fetal Wellbeing: Cat I Pain Control: Breathing and relaxation I/D: GBS neg; intact; afibrile Anticipated MOD: SVD   Alison Kelly 08/17/2013, 4:17 AM

## 2013-08-17 NOTE — Transfer of Care (Signed)
Immediate Anesthesia Transfer of Care Note  Patient: Alison Kelly  Procedure(s) Performed: Procedure(s): CESAREAN SECTION (N/A)  Patient Location: PACU  Anesthesia Type:Epidural  Level of Consciousness: awake, alert  and oriented  Airway & Oxygen Therapy: Patient Spontanous Breathing  Post-op Assessment: Report given to PACU RN and Post -op Vital signs reviewed and stable  Post vital signs: Reviewed and stable  Complications: No apparent anesthesia complications
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
## 2013-08-18 DIAGNOSIS — Z98891 History of uterine scar from previous surgery: Secondary | ICD-10-CM

## 2013-08-18 HISTORY — DX: History of uterine scar from previous surgery: Z98.891

## 2013-08-18 LAB — CBC
HCT: 26.6 % — ABNORMAL LOW (ref 36.0–46.0)
MCHC: 33.1 g/dL (ref 30.0–36.0)
MCV: 84.7 fL (ref 78.0–100.0)
Platelets: 184 10*3/uL (ref 150–400)
RDW: 15.3 % (ref 11.5–15.5)

## 2013-08-18 MED ORDER — FERROUS SULFATE 325 (65 FE) MG PO TABS
325.0000 mg | ORAL_TABLET | Freq: Every day | ORAL | Status: DC
  Administered 2013-08-18: 325 mg via ORAL

## 2013-08-18 NOTE — Progress Notes (Signed)
Subjective: Postpartum Day 1: Cesarean Delivery due to FTD Patient up ad lib, reports no syncope or dizziness.  Up to BR without difficulty, foley out, with spontaneous voiding. Feeding:  Breast Contraceptive plan:  Declines at present  Objective: Vital signs in last 24 hours: Temp:  [97.7 F (36.5 C)-99.6 F (37.6 C)] 97.8 F (36.6 C) (09/06 0445) Pulse Rate:  [81-122] 96 (09/06 0445) Resp:  [16-31] 18 (09/06 0445) BP: (97-152)/(53-136) 99/62 mmHg (09/06 0445) SpO2:  [97 %-100 %] 98 % (09/06 0445)  Physical Exam:  General: alert Lochia: appropriate Uterine Fundus: firm Incision: healing well DVT Evaluation: No evidence of DVT seen on physical exam. Negative Homan's sign. JP drain:   NA   Recent Labs  08/16/13 2205 08/18/13 0640  HGB 10.8* 8.8*  HCT 32.6* 26.6*    Assessment/Plan: Status post Cesarean section day 1 Anemia   Plan: Check orthostatics Fe SO4 daily Repeat CBC tomorrow.     Nigel Bridgeman 08/18/2013, 8:19 AM

## 2013-08-18 NOTE — Anesthesia Postprocedure Evaluation (Signed)
  Anesthesia Post-op Note  Patient: Alison Kelly  Procedure(s) Performed: Procedure(s): CESAREAN SECTION (N/A)  Patient Location: PACU  Anesthesia Type:Epidural  Level of Consciousness: awake, alert  and oriented  Airway and Oxygen Therapy: Patient Spontanous Breathing  Post-op Pain: none  Post-op Assessment: Post-op Vital signs reviewed, Patient's Cardiovascular Status Stable, Respiratory Function Stable, Patent Airway, No signs of Nausea or vomiting, Pain level controlled, No headache and No backache  Post-op Vital Signs: Reviewed and stable  Complications: No apparent anesthesia complications

## 2013-08-18 NOTE — Anesthesia Postprocedure Evaluation (Signed)
  Anesthesia Post-op Note  Patient: Alison Kelly  Procedure(s) Performed: Procedure(s): CESAREAN SECTION (N/A)  Patient Location: Mother/Baby  Anesthesia Type:Epidural  Level of Consciousness: awake, alert  and oriented  Airway and Oxygen Therapy: Patient Spontanous Breathing  Post-op Pain: mild  Post-op Assessment: Patient's Cardiovascular Status Stable, Respiratory Function Stable, Patent Airway, No signs of Nausea or vomiting, Pain level controlled, No headache, No backache, No residual numbness and No residual motor weakness  Post-op Vital Signs: Reviewed and stable  Complications: No apparent anesthesia complications

## 2013-08-19 ENCOUNTER — Encounter (HOSPITAL_COMMUNITY)
Admission: RE | Admit: 2013-08-19 | Discharge: 2013-08-19 | Disposition: A | Discharge status: Home / Self Care | Payer: BC Managed Care – PPO | Source: Ambulatory Visit | Attending: Obstetrics and Gynecology | Admitting: Obstetrics and Gynecology

## 2013-08-19 DIAGNOSIS — F410 Panic disorder [episodic paroxysmal anxiety] without agoraphobia: Secondary | ICD-10-CM

## 2013-08-19 DIAGNOSIS — O923 Agalactia: Secondary | ICD-10-CM | POA: Insufficient documentation

## 2013-08-19 HISTORY — DX: Panic disorder (episodic paroxysmal anxiety): F41.0

## 2013-08-19 LAB — CBC
HCT: 24.6 % — ABNORMAL LOW (ref 36.0–46.0)
Hemoglobin: 8.2 g/dL — ABNORMAL LOW (ref 12.0–15.0)
MCH: 28.3 pg (ref 26.0–34.0)
MCH: 28.5 pg (ref 26.0–34.0)
MCHC: 31.9 g/dL (ref 30.0–36.0)
Platelets: 181 10*3/uL (ref 150–400)
RBC: 2.88 MIL/uL — ABNORMAL LOW (ref 3.87–5.11)
RDW: 15.5 % (ref 11.5–15.5)

## 2013-08-19 MED ORDER — FERROUS SULFATE 325 (65 FE) MG PO TABS: 325.0000 mg | ORAL_TABLET | Freq: Three times a day (TID) | ORAL | Status: DC

## 2013-08-19 MED ORDER — FLUVOXAMINE MALEATE 50 MG PO TABS: ORAL_TABLET | ORAL | Status: DC

## 2013-08-19 MED ORDER — HYDROMORPHONE HCL 2 MG PO TABS: 2.0000 mg | ORAL_TABLET | ORAL | Status: DC | PRN

## 2013-08-19 MED ORDER — IBUPROFEN 600 MG PO TABS: 600.0000 mg | ORAL_TABLET | Freq: Four times a day (QID) | ORAL | Status: DC | PRN

## 2013-08-19 NOTE — Progress Notes (Signed)
Subjective: Postpartum Day 2: Cesarean Delivery Patient reports no nausea, vomiting, incisional pain.  Is tolerating PO and no problems voiding.  Ambulating without dizziness.  Has OCD and hx of panic attackes, and expresses concern about possibly touching dried blood in March in a public toilet and possibility of contracting disease that can be passed on to infant.  Wants repeat of prenatal HIV, RPR,HBsAG.    Objective: Vital signs in last 24 hours: Temp:  [97 F (36.1 C)-98 F (36.7 C)] 98 F (36.7 C) (09/07 0540) Pulse Rate:  [90-115] 115 (09/07 0540) Resp:  [18-20] 18 (09/07 0640) BP: (96-127)/(61-72) 127/72 mmHg (09/07 0540) SpO2:  [98 %-100 %] 98 % (09/07 0540)  Physical Exam:  General: alert, cooperative and no distress Lochia: appropriate Uterine Fundus: firm, nontender Incision: healing well DVT Evaluation: No evidence of DVT seen on physical exam.   Recent Labs  08/18/13 0640 08/19/13 0628  HGB 8.8* 7.5*  HCT 26.6* 23.5*    Assessment/ Status post Cesarean section. Doing well postoperatively. Asymptomatic post op anemia  OCD and panic attacks  Plan Discharge home  Per pt request with standard precautions and return to clinic in 4-6 weeks. CBC, RPR,HIV, HBsAG.  Pt will call office in 1 wk for results Pt reassured that any untoward effect from contact described is unlikely.  Eddis Pingleton P 08/19/2013, 7:54 AM

## 2013-08-19 NOTE — Progress Notes (Signed)
Extensive discharge teaching!!!!!!!

## 2013-08-19 NOTE — Discharge Summary (Signed)
Physician Discharge Summary  Patient ID: Alison Kelly MRN: [id number] DOB/AGE: Apr 15, 1986 27 y.o.  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    Hospital Course: The patient was admitted to the hospital for induction of labor and underwent a prolonged labor course reaching 10 cm and pushing for greater than 2 hours. At the time of diagnosis of a prolonged second stage, the patient was given the options of continued pushing,  attempt at vacuum assisted vaginal delivery or cesarean section. She opted for primary cesarean section. The patient has done well from a symptomatic standpoint and had rapid return of gastrointestinal and urinary function. She did have a significant postoperative anemia which was asymptomatic. She was able to ambulate without any dizziness or other difficulty as well as shower and do other activities of daily living. She did on her first postoperative evening have a panic attack associated with concerns that she had touched dried blood in a public bathroom in March of 4098 and at that would adversely affect her baby.  She requested additional blood work for HIV, RPR, hepatitis.  These were drawn prior to her discharge and she is to call the office in one week for results. She discussed the fact that her obsessive compulsive disorder and panic attacks were better controlled with the Luvox 150 mg daily that she used prior to her pregnancy and that she would like to return to this medication. Consultation with the lactation medicine NIH  website revealed no adverse affects from Luvox on breast-fed infants having been reported including a limited amount of long-term followup on growth and development. A decision was thus made to discontinue the patient's sertraline and to begin Luvox 50 mg at bedtime, increasing by 50 mg a day every 4 days to reach her previous dose of 150 mg. Consultation with the patient's pediatrician was obtained as well.    Significant Diagnostic Studies: labs: As noted above   Discharge Exam: Blood pressure 104/62, pulse 86, temperature 98 F (36.7 C), temperature source Oral, resp. rate 18, height 5\' 4"  (1.626 m), weight 210 lb (95.255 kg), SpO2 97.00%, unknown if currently breastfeeding. General appearance: alert, cooperative and no distress Resp: clear to auscultation bilaterally Cardio: regular rate and rhythm, S1, S2 normal, no murmur, click, rub or gallop GI: soft, non-tender; bowel sounds normal; no masses,  no organomegaly.  Abdomen is appropriately tender for postoperative exam Extremities: extremities normal, atraumatic, no cyanosis or edema Incision/Wound: clean and dry   Results for orders placed during the hospital encounter of 08/16/13 (from the past 48 hour(s))  CBC     Status: Abnormal   Collection Time    08/18/13  6:40 AM      Result Value Range   WBC 16.8 (*) 4.0 - 10.5 K/uL   RBC 3.14 (*) 3.87 - 5.11 MIL/uL   Hemoglobin 8.8 (*) 12.0 - 15.0 g/dL  Comment: DELTA CHECK NOTED     REPEATED TO VERIFY   HCT 26.6 (*) 36.0 - 46.0 %   MCV 84.7  78.0 - 100.0 fL   MCH 28.0  26.0 - 34.0 pg   MCHC 33.1  30.0 - 36.0 g/dL   RDW 40.9  81.1 - 91.4 %   Platelets 184  150 - 400 K/uL  CBC     Status: Abnormal   Collection Time    08/19/13  6:28 AM      Result Value Range   WBC 14.2 (*) 4.0 - 10.5 K/uL   RBC 2.65 (*) 3.87 - 5.11 MIL/uL   Hemoglobin 7.5 (*) 12.0 - 15.0 g/dL   HCT 78.2 (*) 95.6 - 21.3 %   MCV 88.7  78.0 - 100.0 fL   MCH 28.3  26.0 - 34.0 pg   MCHC 31.9  30.0 - 36.0 g/dL    RDW 08.6  57.8 - 46.9 %   Platelets 181  150 - 400 K/uL  CBC     Status: Abnormal   Collection Time    08/19/13  8:54 AM      Result Value Range   WBC 15.1 (*) 4.0 - 10.5 K/uL   RBC 2.88 (*) 3.87 - 5.11 MIL/uL   Hemoglobin 8.2 (*) 12.0 - 15.0 g/dL   HCT 62.9 (*) 52.8 - 41.3 %   MCV 85.4  78.0 - 100.0 fL   MCH 28.5  26.0 - 34.0 pg   MCHC 33.3  30.0 - 36.0 g/dL   RDW 24.4  01.0 - 27.2 %   Platelets 205  150 - 400 K/uL   Disposition: 01-Home or Self Care Infant will be discharged home with mom Female infant. Apgars 9 and 9 Weight: 8 pounds 2.9 ounces     Medication List    STOP taking these medications       sertraline 25 MG tablet  Commonly known as:  ZOLOFT      TAKE these medications       ferrous sulfate 325 (65 FE) MG tablet  Take 1 tablet (325 mg total) by mouth 3 (three) times daily with meals.     fluvoxaMINE 50 MG tablet  Commonly known as:  LUVOX  One tablet by mouth at bedtime for 4 days, then increase to two tablets by mouth for 4 days, then 3 tablets by mouth at bedtime thereafter.     HYDROmorphone 2 MG tablet  Commonly known as:  DILAUDID  Take 1 tablet (2 mg total) by mouth every 3 (three) hours as needed.     ibuprofen 600 MG tablet  Commonly known as:  ADVIL,MOTRIN  Take 1 tablet (600 mg total) by mouth every 6 (six) hours as needed for pain.     prenatal multivitamin Tabs tablet  Take 1 tablet by mouth daily.       Follow-up Information   Follow up with [facility].   Contact information:   [address] [place] [address]       Follow up with [facility].. Schedule an appointment as soon as possible for a visit in 2 weeks.      SignedHal Morales 08/19/2013, 10:48 PM

## 2013-08-20 ENCOUNTER — Encounter (HOSPITAL_COMMUNITY): Payer: Self-pay | Admitting: Obstetrics and Gynecology

## 2013-08-20 LAB — HEPATITIS B SURFACE ANTIGEN: Hepatitis B Surface Ag: NEGATIVE

## 2013-08-20 LAB — HIV ANTIBODY (ROUTINE TESTING W REFLEX): HIV: NONREACTIVE

## 2013-09-19 ENCOUNTER — Encounter (HOSPITAL_COMMUNITY)
Admission: RE | Admit: 2013-09-19 | Discharge: 2013-09-19 | Disposition: A | Discharge status: Home / Self Care | Payer: BC Managed Care – PPO | Source: Ambulatory Visit | Attending: Obstetrics and Gynecology | Admitting: Obstetrics and Gynecology

## 2013-09-19 DIAGNOSIS — O923 Agalactia: Secondary | ICD-10-CM | POA: Insufficient documentation

## 2013-10-20 ENCOUNTER — Encounter (HOSPITAL_COMMUNITY)
Admission: RE | Admit: 2013-10-20 | Discharge: 2013-10-20 | Disposition: A | Discharge status: Home / Self Care | Payer: BC Managed Care – PPO | Source: Ambulatory Visit | Attending: Obstetrics and Gynecology | Admitting: Obstetrics and Gynecology

## 2013-10-20 DIAGNOSIS — O923 Agalactia: Secondary | ICD-10-CM | POA: Insufficient documentation

## 2013-11-07 ENCOUNTER — Ambulatory Visit: Payer: BC Managed Care – PPO | Admitting: Family Medicine

## 2013-11-20 ENCOUNTER — Encounter (HOSPITAL_COMMUNITY)
Admission: RE | Admit: 2013-11-20 | Discharge: 2013-11-20 | Disposition: A | Discharge status: Home / Self Care | Payer: BC Managed Care – PPO | Source: Ambulatory Visit | Attending: Obstetrics and Gynecology | Admitting: Obstetrics and Gynecology

## 2013-11-20 DIAGNOSIS — O923 Agalactia: Secondary | ICD-10-CM | POA: Insufficient documentation

## 2013-12-10 ENCOUNTER — Encounter: Payer: BC Managed Care – PPO | Admitting: Family Medicine

## 2013-12-10 DIAGNOSIS — Z0289 Encounter for other administrative examinations: Secondary | ICD-10-CM

## 2013-12-10 NOTE — Progress Notes (Signed)
Error   This encounter was created in error - please disregard. 

## 2013-12-21 ENCOUNTER — Encounter (HOSPITAL_COMMUNITY)
Admission: RE | Admit: 2013-12-21 | Discharge: 2013-12-21 | Disposition: A | Discharge status: Home / Self Care | Payer: BC Managed Care – PPO | Source: Ambulatory Visit | Attending: Obstetrics and Gynecology | Admitting: Obstetrics and Gynecology

## 2013-12-21 DIAGNOSIS — O923 Agalactia: Secondary | ICD-10-CM | POA: Insufficient documentation

## 2014-01-21 ENCOUNTER — Encounter (HOSPITAL_COMMUNITY)
Admission: RE | Admit: 2014-01-21 | Discharge: 2014-01-21 | Disposition: A | Discharge status: Home / Self Care | Payer: BC Managed Care – PPO | Source: Ambulatory Visit | Attending: Obstetrics and Gynecology | Admitting: Obstetrics and Gynecology

## 2014-01-21 DIAGNOSIS — O923 Agalactia: Secondary | ICD-10-CM | POA: Insufficient documentation

## 2014-02-20 ENCOUNTER — Encounter (HOSPITAL_COMMUNITY)
Admission: RE | Admit: 2014-02-20 | Discharge: 2014-02-20 | Disposition: A | Discharge status: Home / Self Care | Payer: BC Managed Care – PPO | Source: Ambulatory Visit | Attending: Obstetrics and Gynecology | Admitting: Obstetrics and Gynecology

## 2014-02-20 DIAGNOSIS — O923 Agalactia: Secondary | ICD-10-CM | POA: Insufficient documentation

## 2014-03-23 ENCOUNTER — Encounter (HOSPITAL_COMMUNITY)
Admission: RE | Admit: 2014-03-23 | Discharge: 2014-03-23 | Disposition: A | Discharge status: Home / Self Care | Payer: BC Managed Care – PPO | Source: Ambulatory Visit | Attending: Obstetrics and Gynecology | Admitting: Obstetrics and Gynecology

## 2014-03-23 DIAGNOSIS — O923 Agalactia: Secondary | ICD-10-CM | POA: Insufficient documentation

## 2014-04-22 ENCOUNTER — Encounter (HOSPITAL_COMMUNITY)
Admission: RE | Admit: 2014-04-22 | Discharge: 2014-04-22 | Disposition: A | Discharge status: Home / Self Care | Payer: BC Managed Care – PPO | Source: Ambulatory Visit | Attending: Obstetrics and Gynecology | Admitting: Obstetrics and Gynecology

## 2014-04-22 DIAGNOSIS — O923 Agalactia: Secondary | ICD-10-CM | POA: Insufficient documentation

## 2014-05-23 ENCOUNTER — Encounter (HOSPITAL_COMMUNITY)
Admission: RE | Admit: 2014-05-23 | Discharge: 2014-05-23 | Disposition: A | Discharge status: Home / Self Care | Payer: BC Managed Care – PPO | Source: Ambulatory Visit | Attending: Obstetrics and Gynecology | Admitting: Obstetrics and Gynecology

## 2014-05-23 DIAGNOSIS — O923 Agalactia: Secondary | ICD-10-CM | POA: Insufficient documentation

## 2014-06-22 ENCOUNTER — Encounter (HOSPITAL_COMMUNITY)
Admission: RE | Admit: 2014-06-22 | Discharge: 2014-06-22 | Disposition: A | Discharge status: Home / Self Care | Payer: BC Managed Care – PPO | Source: Ambulatory Visit | Attending: Obstetrics and Gynecology | Admitting: Obstetrics and Gynecology

## 2014-06-22 DIAGNOSIS — O923 Agalactia: Secondary | ICD-10-CM | POA: Insufficient documentation

## 2014-07-23 ENCOUNTER — Encounter (HOSPITAL_COMMUNITY)
Admission: RE | Admit: 2014-07-23 | Discharge: 2014-07-23 | Disposition: A | Discharge status: Home / Self Care | Payer: BC Managed Care – PPO | Source: Ambulatory Visit | Attending: Obstetrics and Gynecology | Admitting: Obstetrics and Gynecology

## 2014-07-23 DIAGNOSIS — O923 Agalactia: Secondary | ICD-10-CM | POA: Diagnosis present

## 2014-08-23 ENCOUNTER — Encounter (HOSPITAL_COMMUNITY)
Admission: RE | Admit: 2014-08-23 | Discharge: 2014-08-23 | Disposition: A | Discharge status: Home / Self Care | Payer: PRIVATE HEALTH INSURANCE | Source: Ambulatory Visit | Attending: Obstetrics and Gynecology | Admitting: Obstetrics and Gynecology

## 2014-08-23 DIAGNOSIS — O923 Agalactia: Secondary | ICD-10-CM | POA: Diagnosis present

## 2014-09-04 ENCOUNTER — Encounter: Payer: Self-pay | Admitting: Internal Medicine

## 2014-09-04 ENCOUNTER — Ambulatory Visit (INDEPENDENT_AMBULATORY_CARE_PROVIDER_SITE_OTHER): Payer: PRIVATE HEALTH INSURANCE | Admitting: Internal Medicine

## 2014-09-04 VITALS — BP 100/60 | HR 84 | Temp 97.8°F | Resp 12 | Ht 65.0 in | Wt 178.0 lb

## 2014-09-04 DIAGNOSIS — R7989 Other specified abnormal findings of blood chemistry: Secondary | ICD-10-CM

## 2014-09-04 DIAGNOSIS — R799 Abnormal finding of blood chemistry, unspecified: Secondary | ICD-10-CM

## 2014-09-04 LAB — TSH: TSH: 0.46 u[IU]/mL (ref 0.35–4.50)

## 2014-09-04 LAB — T3, FREE: T3, Free: 3.3 pg/mL (ref 2.3–4.2)

## 2014-09-04 LAB — T4, FREE: FREE T4: 0.81 ng/dL (ref 0.60–1.60)
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
## 2014-09-04 NOTE — Patient Instructions (Signed)
Please stop at the lab. Please come back for a follow-up appointment in 6 months  Hypothyroidism The thyroid is a large gland located in the lower front of your neck. The thyroid gland helps control metabolism. Metabolism is how your body handles food. It controls metabolism with the hormone thyroxine. When this gland is underactive (hypothyroid), it produces too little hormone.  CAUSES These include:   Absence or destruction of thyroid tissue.  Goiter due to iodine deficiency.  Goiter due to medications.  Congenital defects (since birth).  Problems with the pituitary. This causes a lack of TSH (thyroid stimulating hormone). This hormone tells the thyroid to turn out more hormone. SYMPTOMS  Lethargy (feeling as though you have no energy)  Cold intolerance  Weight gain (in spite of normal food intake)  Dry skin  Coarse hair  Menstrual irregularity (if severe, may lead to infertility)  Slowing of thought processes Cardiac problems are also caused by insufficient amounts of thyroid hormone. Hypothyroidism in the newborn is cretinism, and is an extreme form. It is important that this form be treated adequately and immediately or it will lead rapidly to retarded physical and mental development. DIAGNOSIS  To prove hypothyroidism, your caregiver may do blood tests and ultrasound tests. Sometimes the signs are hidden. It may be necessary for your caregiver to watch this illness with blood tests either before or after diagnosis and treatment. TREATMENT  Low levels of thyroid hormone are increased by using synthetic thyroid hormone. This is a safe, effective treatment. It usually takes about four weeks to gain the full effects of the medication. After you have the full effect of the medication, it will generally take another four weeks for problems to leave. Your caregiver may start you on low doses. If you have had heart problems the dose may be gradually increased. It is generally not an  emergency to get rapidly to normal. HOME CARE INSTRUCTIONS   Take your medications as your caregiver suggests. Let your caregiver know of any medications you are taking or start taking. Your caregiver will help you with dosage schedules.  As your condition improves, your dosage needs may increase. It will be necessary to have continuing blood tests as suggested by your caregiver.  Report all suspected medication side effects to your caregiver. SEEK MEDICAL CARE IF: Seek medical care if you develop:  Sweating.  Tremulousness (tremors).  Anxiety.  Rapid weight loss.  Heat intolerance.  Emotional swings.  Diarrhea.  Weakness. SEEK IMMEDIATE MEDICAL CARE IF:  You develop chest pain, an irregular heart beat (palpitations), or a rapid heart beat. MAKE SURE YOU:   Understand these instructions.  Will watch your condition.  Will get help right away if you are not doing well or get worse. Document Released: 11/29/2005 Document Revised: 02/21/2012 Document Reviewed: 07/19/2008 [facility] Patient Information 2015 [place], [place]. This information is not intended to replace advice given to you by your health care provider. Make sure you discuss any questions you have with your health care provider.

## 2014-09-05 LAB — THYROID PEROXIDASE ANTIBODY: Thyroperoxidase Ab SerPl-aCnc: 200 IU/mL — ABNORMAL HIGH (ref <9)

## 2014-09-22 ENCOUNTER — Encounter (HOSPITAL_COMMUNITY)
Admission: RE | Admit: 2014-09-22 | Discharge: 2014-09-22 | Disposition: A | Discharge status: Home / Self Care | Payer: BC Managed Care – PPO | Source: Ambulatory Visit | Attending: Obstetrics and Gynecology | Admitting: Obstetrics and Gynecology

## 2014-09-22 DIAGNOSIS — O923 Agalactia: Secondary | ICD-10-CM | POA: Insufficient documentation

## 2014-10-14 ENCOUNTER — Encounter: Payer: Self-pay | Admitting: Internal Medicine

## 2014-10-14 ENCOUNTER — Telehealth: Payer: Self-pay | Admitting: Internal Medicine

## 2014-10-14 NOTE — Telephone Encounter (Signed)
Alison Kelly,  Alison Kelly would like to know if Dr. Cruzita Lederer has received her medical records from [facility]?   Please advise patient   Thank you

## 2014-10-15 NOTE — Telephone Encounter (Signed)
Called pt and advised her.

## 2014-10-15 NOTE — Telephone Encounter (Signed)
Not yet

## 2014-10-15 NOTE — Telephone Encounter (Signed)
Have you received any medical records from [facility]. Please advise.

## 2015-11-28 ENCOUNTER — Other Ambulatory Visit: Payer: Self-pay | Admitting: *Deleted

## 2015-11-28 ENCOUNTER — Telehealth: Payer: Self-pay | Admitting: Internal Medicine

## 2015-11-28 DIAGNOSIS — R7989 Other specified abnormal findings of blood chemistry: Secondary | ICD-10-CM

## 2015-11-28 NOTE — Telephone Encounter (Signed)
Please read message below and advise.  

## 2015-11-28 NOTE — Telephone Encounter (Signed)
Patient stated Dr Cruzita Lederer Dx her with hashimoto's disease and she is now pregnant, an has appointment with Merrilyn Puma, she would like to know if she need to get blood prior, please advise

## 2015-11-28 NOTE — Telephone Encounter (Signed)
Returned pt's call. Pt wants to have labs done on Monday. Labs entered.

## 2015-11-28 NOTE — Telephone Encounter (Signed)
OK, let's check TSH, fT4 and fT3. Please ask her not to take her prenatal vitamins the night before the labs. Also, nothing with Biotin the day before. She can come on Monday or today if she can. Alternatively, we can check them at the time of the visit. Her preference.

## 2015-12-02 ENCOUNTER — Encounter: Payer: Self-pay | Admitting: Internal Medicine

## 2015-12-02 ENCOUNTER — Ambulatory Visit (INDEPENDENT_AMBULATORY_CARE_PROVIDER_SITE_OTHER): Payer: BLUE CROSS/BLUE SHIELD | Admitting: Internal Medicine

## 2015-12-02 VITALS — BP 124/68 | HR 112 | Temp 97.4°F | Resp 12 | Wt 182.8 lb

## 2015-12-02 DIAGNOSIS — R7989 Other specified abnormal findings of blood chemistry: Secondary | ICD-10-CM | POA: Diagnosis not present

## 2015-12-02 LAB — T4, FREE: Free T4: 0.79 ng/dL (ref 0.60–1.60)

## 2015-12-02 LAB — T3, FREE: T3, Free: 3.7 pg/mL (ref 2.3–4.2)

## 2015-12-02 LAB — TSH: TSH: 4.7 u[IU]/mL — AB (ref 0.35–4.50)
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
## 2015-12-02 NOTE — Patient Instructions (Signed)
Please stop at the lab.  Please come back for a follow-up appointment in 3 months.  Take the thyroid hormone every day, with water, at least 30 minutes before breakfast, separated by at least 4 hours from: - acid reflux medications - calcium - iron - multivitamins

## 2015-12-03 MED ORDER — LEVOTHYROXINE SODIUM 25 MCG PO TABS: 25.0000 ug | ORAL_TABLET | Freq: Every day | ORAL | Status: DC

## 2015-12-26 LAB — OB RESULTS CONSOLE HIV ANTIBODY (ROUTINE TESTING): HIV: NONREACTIVE

## 2015-12-26 LAB — OB RESULTS CONSOLE GC/CHLAMYDIA
CHLAMYDIA, DNA PROBE: NEGATIVE
Gonorrhea: NEGATIVE

## 2015-12-26 LAB — OB RESULTS CONSOLE ABO/RH: RH TYPE: POSITIVE

## 2015-12-26 LAB — OB RESULTS CONSOLE HEPATITIS B SURFACE ANTIGEN: Hepatitis B Surface Ag: NEGATIVE

## 2015-12-26 LAB — OB RESULTS CONSOLE RPR: RPR: NONREACTIVE

## 2015-12-26 LAB — OB RESULTS CONSOLE ANTIBODY SCREEN: Antibody Screen: NEGATIVE

## 2016-01-01 ENCOUNTER — Other Ambulatory Visit (INDEPENDENT_AMBULATORY_CARE_PROVIDER_SITE_OTHER): Payer: BLUE CROSS/BLUE SHIELD

## 2016-01-01 DIAGNOSIS — R7989 Other specified abnormal findings of blood chemistry: Secondary | ICD-10-CM

## 2016-01-01 LAB — T4, FREE: Free T4: 0.76 ng/dL (ref 0.60–1.60)

## 2016-01-01 LAB — TSH: TSH: 2.01 u[IU]/mL (ref 0.35–4.50)

## 2016-01-02 ENCOUNTER — Other Ambulatory Visit: Payer: Self-pay | Admitting: *Deleted

## 2016-01-02 DIAGNOSIS — R7989 Other specified abnormal findings of blood chemistry: Secondary | ICD-10-CM

## 2016-02-12 ENCOUNTER — Telehealth: Payer: Self-pay | Admitting: Internal Medicine

## 2016-02-12 MED ORDER — LEVOTHYROXINE SODIUM 25 MCG PO TABS: 25.0000 ug | ORAL_TABLET | Freq: Every day | ORAL | Status: DC

## 2016-02-12 NOTE — Telephone Encounter (Signed)
Patient would like an Rx refill   Rx: Levothyroxine    Thank you

## 2016-02-12 NOTE — Telephone Encounter (Signed)
Refill sent to pt's pharmacy. 

## 2016-03-02 ENCOUNTER — Encounter: Payer: Self-pay | Admitting: Internal Medicine

## 2016-03-02 ENCOUNTER — Ambulatory Visit (INDEPENDENT_AMBULATORY_CARE_PROVIDER_SITE_OTHER): Payer: BLUE CROSS/BLUE SHIELD | Admitting: Internal Medicine

## 2016-03-02 VITALS — BP 114/64 | HR 92 | Temp 98.5°F | Resp 12 | Wt 184.6 lb

## 2016-03-02 DIAGNOSIS — R42 Dizziness and giddiness: Secondary | ICD-10-CM

## 2016-03-02 DIAGNOSIS — E038 Other specified hypothyroidism: Secondary | ICD-10-CM | POA: Insufficient documentation

## 2016-03-02 DIAGNOSIS — R7989 Other specified abnormal findings of blood chemistry: Secondary | ICD-10-CM | POA: Diagnosis not present

## 2016-03-02 DIAGNOSIS — E063 Autoimmune thyroiditis: Secondary | ICD-10-CM | POA: Diagnosis not present

## 2016-03-02 LAB — T4, FREE: Free T4: 0.79 ng/dL (ref 0.60–1.60)

## 2016-03-02 LAB — TSH: TSH: 2.76 u[IU]/mL (ref 0.35–4.50)

## 2016-03-02 MED ORDER — LEVOTHYROXINE SODIUM 25 MCG PO TABS: 25.0000 ug | ORAL_TABLET | Freq: Every day | ORAL | Status: DC
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
## 2016-03-02 NOTE — Patient Instructions (Signed)
Please stop at the lab.  Continue Levothyroxine 25 mcg daily.  Take the thyroid hormone every day, with water, at least 30 minutes before breakfast, separated by at least 4 hours from: - acid reflux medications - calcium - iron - multivitamins  Please come back for a follow-up appointment in 4 months.   Alison Kelly

## 2016-03-03 LAB — THYROID PEROXIDASE ANTIBODY: Thyroperoxidase Ab SerPl-aCnc: 15 IU/mL — ABNORMAL HIGH (ref <9)

## 2016-05-11 ENCOUNTER — Telehealth: Payer: Self-pay | Admitting: Internal Medicine

## 2016-05-11 NOTE — Telephone Encounter (Signed)
Pt takes levothyroxine for the hashimotos ob just rx zoloft for the anxiety during her pregnancy-how is she to take these meds

## 2016-05-11 NOTE — Telephone Encounter (Signed)
No interaction between the 2.

## 2016-05-11 NOTE — Telephone Encounter (Signed)
Please read message below and advise.  

## 2016-05-12 NOTE — Telephone Encounter (Signed)
Called pt and advised her per Dr Gherghe's message. Pt voiced understanding.  

## 2016-05-25 ENCOUNTER — Other Ambulatory Visit: Payer: Self-pay | Admitting: Internal Medicine

## 2016-06-08 ENCOUNTER — Inpatient Hospital Stay (HOSPITAL_COMMUNITY): Payer: BLUE CROSS/BLUE SHIELD

## 2016-06-08 ENCOUNTER — Encounter (HOSPITAL_COMMUNITY): Payer: Self-pay | Admitting: *Deleted

## 2016-06-08 ENCOUNTER — Inpatient Hospital Stay (HOSPITAL_COMMUNITY)
Admission: AD | Admit: 2016-06-08 | Discharge: 2016-06-08 | Disposition: A | Discharge status: Home / Self Care | Payer: BLUE CROSS/BLUE SHIELD | Source: Ambulatory Visit | Attending: Obstetrics and Gynecology | Admitting: Obstetrics and Gynecology

## 2016-06-08 DIAGNOSIS — F419 Anxiety disorder, unspecified: Secondary | ICD-10-CM | POA: Insufficient documentation

## 2016-06-08 DIAGNOSIS — Z3689 Encounter for other specified antenatal screening: Secondary | ICD-10-CM

## 2016-06-08 DIAGNOSIS — O99343 Other mental disorders complicating pregnancy, third trimester: Secondary | ICD-10-CM | POA: Diagnosis not present

## 2016-06-08 DIAGNOSIS — M797 Fibromyalgia: Secondary | ICD-10-CM | POA: Insufficient documentation

## 2016-06-08 DIAGNOSIS — O99283 Endocrine, nutritional and metabolic diseases complicating pregnancy, third trimester: Secondary | ICD-10-CM | POA: Diagnosis not present

## 2016-06-08 DIAGNOSIS — O368130 Decreased fetal movements, third trimester, not applicable or unspecified: Secondary | ICD-10-CM | POA: Diagnosis present

## 2016-06-08 DIAGNOSIS — O368190 Decreased fetal movements, unspecified trimester, not applicable or unspecified: Secondary | ICD-10-CM | POA: Diagnosis not present

## 2016-06-08 DIAGNOSIS — O289 Unspecified abnormal findings on antenatal screening of mother: Secondary | ICD-10-CM

## 2016-06-08 DIAGNOSIS — F429 Obsessive-compulsive disorder, unspecified: Secondary | ICD-10-CM | POA: Diagnosis not present

## 2016-06-08 DIAGNOSIS — E063 Autoimmune thyroiditis: Secondary | ICD-10-CM | POA: Diagnosis not present

## 2016-06-08 DIAGNOSIS — Z3A32 32 weeks gestation of pregnancy: Secondary | ICD-10-CM | POA: Diagnosis not present

## 2016-06-08 DIAGNOSIS — M069 Rheumatoid arthritis, unspecified: Secondary | ICD-10-CM | POA: Insufficient documentation

## 2016-06-08 DIAGNOSIS — O288 Other abnormal findings on antenatal screening of mother: Secondary | ICD-10-CM

## 2016-06-08 HISTORY — DX: Autoimmune thyroiditis: E06.3

## 2016-06-08 HISTORY — DX: Obsessive-compulsive disorder, unspecified: F42.9

## 2016-06-08 NOTE — MAU Note (Signed)
Sent from office.  Pt had complaints of decreased fetal movement.  decels on monitor noted- sent in for prolonged monitoring.
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
## 2016-06-08 NOTE — Discharge Instructions (Signed)
Keep your scheduled appt for prenatal care. Call the office or midwife on call with further concerns or return to MAU as needed.

## 2016-06-17 ENCOUNTER — Other Ambulatory Visit: Payer: Self-pay | Admitting: Obstetrics and Gynecology

## 2016-06-29 ENCOUNTER — Other Ambulatory Visit: Payer: Self-pay | Admitting: Internal Medicine

## 2016-06-29 ENCOUNTER — Encounter: Payer: Self-pay | Admitting: Internal Medicine

## 2016-06-29 ENCOUNTER — Ambulatory Visit (INDEPENDENT_AMBULATORY_CARE_PROVIDER_SITE_OTHER): Payer: BLUE CROSS/BLUE SHIELD | Admitting: Internal Medicine

## 2016-06-29 VITALS — BP 122/68 | HR 93 | Ht 66.0 in | Wt 191.0 lb

## 2016-06-29 DIAGNOSIS — E063 Autoimmune thyroiditis: Principal | ICD-10-CM

## 2016-06-29 DIAGNOSIS — E038 Other specified hypothyroidism: Secondary | ICD-10-CM

## 2016-06-29 LAB — T4, FREE: FREE T4: 0.69 ng/dL (ref 0.60–1.60)

## 2016-06-29 LAB — TSH: TSH: 2.06 u[IU]/mL (ref 0.35–4.50)

## 2016-06-29 NOTE — Patient Instructions (Addendum)
Please continue Levothyroxine 25 mcg daily.  Take the thyroid hormone every day, with water, at least 30 minutes before breakfast, separated by at least 4 hours from: - acid reflux medications - calcium - iron - multivitamins  Please stop at the lab.  Please come back for a follow-up appointment in 5 months, but 1 month after you give birth for labs.
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
## 2016-06-30 ENCOUNTER — Telehealth: Payer: Self-pay

## 2016-06-30 NOTE — Telephone Encounter (Signed)
Called and spoke with patient about lab results, and to continue taking medication. No questions or concerns.

## 2016-07-15 ENCOUNTER — Encounter (HOSPITAL_COMMUNITY): Payer: Self-pay

## 2016-07-20 ENCOUNTER — Encounter (HOSPITAL_COMMUNITY)
Admission: RE | Admit: 2016-07-20 | Discharge: 2016-07-20 | Disposition: A | Discharge status: Home / Self Care | Payer: BLUE CROSS/BLUE SHIELD | Source: Ambulatory Visit | Attending: Obstetrics and Gynecology | Admitting: Obstetrics and Gynecology

## 2016-07-20 HISTORY — DX: Major depressive disorder, single episode, unspecified: F32.9

## 2016-07-20 HISTORY — DX: Rheumatoid arthritis, unspecified: M06.9

## 2016-07-20 HISTORY — DX: Non-celiac gluten sensitivity: K90.41

## 2016-07-20 HISTORY — DX: Hypothyroidism, unspecified: E03.9

## 2016-07-20 HISTORY — DX: Depression, unspecified: F32.A

## 2016-07-20 LAB — CBC
HEMATOCRIT: 36.4 % (ref 36.0–46.0)
HEMOGLOBIN: 12.2 g/dL (ref 12.0–15.0)
MCH: 28.8 pg (ref 26.0–34.0)
MCHC: 33.5 g/dL (ref 30.0–36.0)
MCV: 85.8 fL (ref 78.0–100.0)
Platelets: 211 10*3/uL (ref 150–400)
RBC: 4.24 MIL/uL (ref 3.87–5.11)
RDW: 14.7 % (ref 11.5–15.5)
WBC: 8.3 10*3/uL (ref 4.0–10.5)

## 2016-07-20 LAB — TYPE AND SCREEN
ABO/RH(D): A POS
Antibody Screen: NEGATIVE

## 2016-07-20 NOTE — Patient Instructions (Signed)
[place]  07/20/2016   Your procedure is scheduled on:  07/23/2016  Enter through the Main Entrance of [facility] [place] up the phone at the desk and dial [phone].   Call this number if you have problems the morning of surgery: [phone]   Remember:   Do not eat food:After Midnight.  Do not drink clear liquids: After Midnight.  Take these medicines the morning of surgery with A SIP OF WATER: synthroid and zoloft   Do not wear jewelry, make-up or nail polish.  Do not wear lotions, powders, or perfumes. You may wear deodorant.  Do not shave 48 hours prior to surgery.  Do not bring valuables to the hospital.  [facility] is not   responsible for any belongings or valuables brought to the hospital.  Contacts, dentures or bridgework may not be worn into surgery.  Leave suitcase in the car. After surgery it may be brought to your room.  For patients admitted to the hospital, checkout time is 11:00 AM the day of              discharge.   Patients discharged the day of surgery will not be allowed to drive             home.  Name and phone number of your driver: na  Special Instructions:   N/A   Please read over the following fact sheets that you were given:   Surgical Site Infection Prevention

## 2016-07-21 LAB — RPR: RPR: NONREACTIVE

## 2016-07-22 MED ORDER — CEFOTETAN DISODIUM 2 G IJ SOLR
2.0000 g | INTRAMUSCULAR | Status: AC
  Administered 2016-07-23: 2 g via INTRAVENOUS
  Filled 2016-07-22: qty 2
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
## 2016-07-23 ENCOUNTER — Encounter (HOSPITAL_COMMUNITY)
Admission: RE | Disposition: A | Discharge status: Home / Self Care | Payer: Self-pay | Source: Ambulatory Visit | Attending: Obstetrics and Gynecology

## 2016-07-23 ENCOUNTER — Inpatient Hospital Stay (HOSPITAL_COMMUNITY): Payer: BLUE CROSS/BLUE SHIELD | Admitting: Anesthesiology

## 2016-07-23 ENCOUNTER — Encounter (HOSPITAL_COMMUNITY): Payer: Self-pay | Admitting: *Deleted

## 2016-07-23 ENCOUNTER — Inpatient Hospital Stay (HOSPITAL_COMMUNITY)
Admission: RE | Admit: 2016-07-23 | Discharge: 2016-07-25 | DRG: 766 | Disposition: A | Discharge status: Home / Self Care | Payer: BLUE CROSS/BLUE SHIELD | Source: Ambulatory Visit | Attending: Obstetrics and Gynecology | Admitting: Obstetrics and Gynecology

## 2016-07-23 DIAGNOSIS — Z3A39 39 weeks gestation of pregnancy: Secondary | ICD-10-CM | POA: Diagnosis not present

## 2016-07-23 DIAGNOSIS — Z811 Family history of alcohol abuse and dependence: Secondary | ICD-10-CM | POA: Diagnosis not present

## 2016-07-23 DIAGNOSIS — D649 Anemia, unspecified: Secondary | ICD-10-CM | POA: Diagnosis present

## 2016-07-23 DIAGNOSIS — Z820 Family history of epilepsy and other diseases of the nervous system: Secondary | ICD-10-CM

## 2016-07-23 DIAGNOSIS — O99284 Endocrine, nutritional and metabolic diseases complicating childbirth: Secondary | ICD-10-CM | POA: Diagnosis present

## 2016-07-23 DIAGNOSIS — O34211 Maternal care for low transverse scar from previous cesarean delivery: Principal | ICD-10-CM | POA: Diagnosis present

## 2016-07-23 DIAGNOSIS — O99214 Obesity complicating childbirth: Secondary | ICD-10-CM | POA: Diagnosis present

## 2016-07-23 DIAGNOSIS — Z6832 Body mass index (BMI) 32.0-32.9, adult: Secondary | ICD-10-CM

## 2016-07-23 DIAGNOSIS — E669 Obesity, unspecified: Secondary | ICD-10-CM | POA: Diagnosis present

## 2016-07-23 DIAGNOSIS — Z8249 Family history of ischemic heart disease and other diseases of the circulatory system: Secondary | ICD-10-CM | POA: Diagnosis not present

## 2016-07-23 DIAGNOSIS — O9902 Anemia complicating childbirth: Secondary | ICD-10-CM | POA: Diagnosis present

## 2016-07-23 DIAGNOSIS — Z833 Family history of diabetes mellitus: Secondary | ICD-10-CM | POA: Diagnosis not present

## 2016-07-23 DIAGNOSIS — F419 Anxiety disorder, unspecified: Secondary | ICD-10-CM | POA: Diagnosis present

## 2016-07-23 DIAGNOSIS — F429 Obsessive-compulsive disorder, unspecified: Secondary | ICD-10-CM | POA: Diagnosis present

## 2016-07-23 DIAGNOSIS — E039 Hypothyroidism, unspecified: Secondary | ICD-10-CM | POA: Diagnosis present

## 2016-07-23 DIAGNOSIS — O99344 Other mental disorders complicating childbirth: Secondary | ICD-10-CM | POA: Diagnosis present

## 2016-07-23 SURGERY — Surgical Case
Anesthesia: Spinal

## 2016-07-23 MED ORDER — MENTHOL 3 MG MT LOZG: 1.0000 | LOZENGE | OROMUCOSAL | Status: DC | PRN

## 2016-07-23 MED ORDER — SIMETHICONE 80 MG PO CHEW
80.0000 mg | CHEWABLE_TABLET | Freq: Three times a day (TID) | ORAL | Status: DC
  Administered 2016-07-23 – 2016-07-25 (×3): 80 mg via ORAL
  Filled 2016-07-23 (×4): qty 1

## 2016-07-23 MED ORDER — OXYTOCIN 10 UNIT/ML IJ SOLN
INTRAVENOUS | Status: DC | PRN
  Administered 2016-07-23: 40 [IU] via INTRAVENOUS

## 2016-07-23 MED ORDER — BUPIVACAINE-EPINEPHRINE 0.5% -1:200000 IJ SOLN
INTRAMUSCULAR | Status: DC | PRN
  Administered 2016-07-23: 10 mL

## 2016-07-23 MED ORDER — NALBUPHINE HCL 10 MG/ML IJ SOLN: 5.0000 mg | Freq: Once | INTRAMUSCULAR | Status: DC | PRN

## 2016-07-23 MED ORDER — OXYTOCIN 40 UNITS IN LACTATED RINGERS INFUSION - SIMPLE MED: 2.5000 [IU]/h | INTRAVENOUS | Status: AC

## 2016-07-23 MED ORDER — PROMETHAZINE HCL 25 MG/ML IJ SOLN: 6.2500 mg | INTRAMUSCULAR | Status: DC | PRN

## 2016-07-23 MED ORDER — COCONUT OIL OIL
1.0000 "application " | TOPICAL_OIL | Status: DC | PRN
  Filled 2016-07-23: qty 120

## 2016-07-23 MED ORDER — PHENYLEPHRINE HCL 10 MG/ML IJ SOLN
INTRAMUSCULAR | Status: DC | PRN
  Administered 2016-07-23: 80 ug via INTRAVENOUS

## 2016-07-23 MED ORDER — ONDANSETRON HCL 4 MG/2ML IJ SOLN
4.0000 mg | Freq: Three times a day (TID) | INTRAMUSCULAR | Status: DC | PRN
Start: 2016-07-23 — End: 2016-07-25

## 2016-07-23 MED ORDER — PHENYLEPHRINE 40 MCG/ML (10ML) SYRINGE FOR IV PUSH (FOR BLOOD PRESSURE SUPPORT)
PREFILLED_SYRINGE | INTRAVENOUS | Status: AC
  Filled 2016-07-23: qty 10

## 2016-07-23 MED ORDER — NALBUPHINE HCL 10 MG/ML IJ SOLN: 5.0000 mg | INTRAMUSCULAR | Status: DC | PRN

## 2016-07-23 MED ORDER — SOD CITRATE-CITRIC ACID 500-334 MG/5ML PO SOLN
ORAL | Status: AC
  Administered 2016-07-23: 30 mL via ORAL
  Filled 2016-07-23: qty 15

## 2016-07-23 MED ORDER — EPHEDRINE SULFATE 50 MG/ML IJ SOLN
INTRAMUSCULAR | Status: DC | PRN
  Administered 2016-07-23: 10 mg via INTRAVENOUS

## 2016-07-23 MED ORDER — SOD CITRATE-CITRIC ACID 500-334 MG/5ML PO SOLN
30.0000 mL | Freq: Once | ORAL | Status: AC
  Administered 2016-07-23: 30 mL via ORAL

## 2016-07-23 MED ORDER — DIPHENHYDRAMINE HCL 25 MG PO CAPS
25.0000 mg | ORAL_CAPSULE | ORAL | Status: DC | PRN
  Filled 2016-07-23: qty 1

## 2016-07-23 MED ORDER — SENNOSIDES-DOCUSATE SODIUM 8.6-50 MG PO TABS
2.0000 | ORAL_TABLET | ORAL | Status: DC
  Administered 2016-07-23 – 2016-07-25 (×2): 2 via ORAL
  Filled 2016-07-23 (×2): qty 2

## 2016-07-23 MED ORDER — DIPHENHYDRAMINE HCL 50 MG/ML IJ SOLN: 12.5000 mg | INTRAMUSCULAR | Status: DC | PRN

## 2016-07-23 MED ORDER — SIMETHICONE 80 MG PO CHEW: 80.0000 mg | CHEWABLE_TABLET | ORAL | Status: DC | PRN

## 2016-07-23 MED ORDER — TETANUS-DIPHTH-ACELL PERTUSSIS 5-2.5-18.5 LF-MCG/0.5 IM SUSP
0.5000 mL | Freq: Once | INTRAMUSCULAR | Status: DC
  Filled 2016-07-23: qty 0.5

## 2016-07-23 MED ORDER — MEDROXYPROGESTERONE ACETATE 150 MG/ML IM SUSP: 150.0000 mg | INTRAMUSCULAR | Status: DC | PRN

## 2016-07-23 MED ORDER — ACETAMINOPHEN 500 MG PO TABS
1000.0000 mg | ORAL_TABLET | Freq: Four times a day (QID) | ORAL | Status: AC
  Administered 2016-07-23 – 2016-07-24 (×3): 1000 mg via ORAL
  Filled 2016-07-23 (×3): qty 2

## 2016-07-23 MED ORDER — SCOPOLAMINE 1 MG/3DAYS TD PT72
MEDICATED_PATCH | TRANSDERMAL | Status: AC
  Filled 2016-07-23: qty 1

## 2016-07-23 MED ORDER — MEPERIDINE HCL 25 MG/ML IJ SOLN: 6.2500 mg | INTRAMUSCULAR | Status: DC | PRN

## 2016-07-23 MED ORDER — EPHEDRINE 5 MG/ML INJ
INTRAVENOUS | Status: AC
  Filled 2016-07-23: qty 10

## 2016-07-23 MED ORDER — HYDROMORPHONE HCL 2 MG PO TABS: 2.0000 mg | ORAL_TABLET | ORAL | Status: DC | PRN

## 2016-07-23 MED ORDER — NALOXONE HCL 2 MG/2ML IJ SOSY
1.0000 ug/kg/h | PREFILLED_SYRINGE | INTRAVENOUS | Status: DC | PRN
  Filled 2016-07-23: qty 2

## 2016-07-23 MED ORDER — MORPHINE SULFATE-NACL 0.5-0.9 MG/ML-% IV SOSY
PREFILLED_SYRINGE | INTRAVENOUS | Status: AC
  Filled 2016-07-23: qty 1

## 2016-07-23 MED ORDER — DIPHENHYDRAMINE HCL 25 MG PO CAPS: 25.0000 mg | ORAL_CAPSULE | Freq: Four times a day (QID) | ORAL | Status: DC | PRN

## 2016-07-23 MED ORDER — SERTRALINE HCL 20 MG/ML PO CONC: 50.0000 mg | Freq: Every day | ORAL | Status: DC

## 2016-07-23 MED ORDER — ACETAMINOPHEN 325 MG PO TABS
650.0000 mg | ORAL_TABLET | ORAL | Status: DC | PRN
  Administered 2016-07-24 – 2016-07-25 (×4): 650 mg via ORAL
  Filled 2016-07-23 (×4): qty 2

## 2016-07-23 MED ORDER — DIBUCAINE 1 % RE OINT: 1.0000 "application " | TOPICAL_OINTMENT | RECTAL | Status: DC | PRN

## 2016-07-23 MED ORDER — BUPIVACAINE IN DEXTROSE 0.75-8.25 % IT SOLN
INTRATHECAL | Status: DC | PRN
Start: 2016-07-23 — End: 2016-07-23
  Administered 2016-07-23: 1.6 mL via INTRATHECAL

## 2016-07-23 MED ORDER — FENTANYL CITRATE (PF) 100 MCG/2ML IJ SOLN
INTRAMUSCULAR | Status: AC
  Filled 2016-07-23: qty 2

## 2016-07-23 MED ORDER — PHENYLEPHRINE 8 MG IN D5W 100 ML (0.08MG/ML) PREMIX OPTIME
INJECTION | INTRAVENOUS | Status: DC | PRN
  Administered 2016-07-23: 60 ug/min via INTRAVENOUS

## 2016-07-23 MED ORDER — LACTATED RINGERS IV SOLN: INTRAVENOUS | Status: DC

## 2016-07-23 MED ORDER — FENTANYL CITRATE (PF) 100 MCG/2ML IJ SOLN
INTRAMUSCULAR | Status: DC | PRN
  Administered 2016-07-23: 10 ug via INTRATHECAL

## 2016-07-23 MED ORDER — ZOLPIDEM TARTRATE 5 MG PO TABS: 5.0000 mg | ORAL_TABLET | Freq: Every evening | ORAL | Status: DC | PRN

## 2016-07-23 MED ORDER — KETOROLAC TROMETHAMINE 30 MG/ML IJ SOLN
INTRAMUSCULAR | Status: AC
  Administered 2016-07-23: 30 mg via INTRAMUSCULAR
  Filled 2016-07-23: qty 1

## 2016-07-23 MED ORDER — SODIUM CHLORIDE 0.9 % IR SOLN
Status: DC | PRN
  Administered 2016-07-23: 1

## 2016-07-23 MED ORDER — OXYTOCIN 10 UNIT/ML IJ SOLN
INTRAMUSCULAR | Status: AC
  Filled 2016-07-23: qty 4

## 2016-07-23 MED ORDER — SIMETHICONE 80 MG PO CHEW
80.0000 mg | CHEWABLE_TABLET | ORAL | Status: DC
  Administered 2016-07-23: 80 mg via ORAL
  Filled 2016-07-23 (×2): qty 1

## 2016-07-23 MED ORDER — ONDANSETRON HCL 4 MG/2ML IJ SOLN
INTRAMUSCULAR | Status: AC
Start: 2016-07-23 — End: 2016-07-23
  Filled 2016-07-23: qty 2

## 2016-07-23 MED ORDER — LEVOTHYROXINE SODIUM 25 MCG PO TABS
25.0000 ug | ORAL_TABLET | Freq: Every day | ORAL | Status: DC
  Administered 2016-07-24 – 2016-07-25 (×2): 25 ug via ORAL
  Filled 2016-07-23 (×3): qty 1

## 2016-07-23 MED ORDER — LACTATED RINGERS IV SOLN
INTRAVENOUS | Status: DC
  Administered 2016-07-23 (×3): via INTRAVENOUS

## 2016-07-23 MED ORDER — PRENATAL MULTIVITAMIN CH
1.0000 | ORAL_TABLET | Freq: Every day | ORAL | Status: DC
  Administered 2016-07-24 – 2016-07-25 (×2): 1 via ORAL
  Filled 2016-07-23 (×2): qty 1

## 2016-07-23 MED ORDER — FENTANYL CITRATE (PF) 100 MCG/2ML IJ SOLN: 25.0000 ug | INTRAMUSCULAR | Status: DC | PRN

## 2016-07-23 MED ORDER — LACTATED RINGERS IV SOLN
Freq: Once | INTRAVENOUS | Status: AC
  Administered 2016-07-23: 09:00:00 via INTRAVENOUS

## 2016-07-23 MED ORDER — BUPIVACAINE-EPINEPHRINE (PF) 0.5% -1:200000 IJ SOLN
INTRAMUSCULAR | Status: AC
  Filled 2016-07-23: qty 30

## 2016-07-23 MED ORDER — PHENYLEPHRINE 8 MG IN D5W 100 ML (0.08MG/ML) PREMIX OPTIME
INJECTION | INTRAVENOUS | Status: AC
  Filled 2016-07-23: qty 100

## 2016-07-23 MED ORDER — MORPHINE SULFATE (PF) 0.5 MG/ML IJ SOLN
INTRAMUSCULAR | Status: DC | PRN
  Administered 2016-07-23: .2 mg via INTRATHECAL

## 2016-07-23 MED ORDER — WITCH HAZEL-GLYCERIN EX PADS: 1.0000 "application " | MEDICATED_PAD | CUTANEOUS | Status: DC | PRN

## 2016-07-23 MED ORDER — MEASLES, MUMPS & RUBELLA VAC ~~LOC~~ INJ
0.5000 mL | INJECTION | Freq: Once | SUBCUTANEOUS | Status: DC
  Filled 2016-07-23: qty 0.5

## 2016-07-23 MED ORDER — SODIUM CHLORIDE 0.9% FLUSH: 3.0000 mL | INTRAVENOUS | Status: DC | PRN

## 2016-07-23 MED ORDER — SCOPOLAMINE 1 MG/3DAYS TD PT72: 1.0000 | MEDICATED_PATCH | Freq: Once | TRANSDERMAL | Status: DC

## 2016-07-23 MED ORDER — KETOROLAC TROMETHAMINE 30 MG/ML IJ SOLN
30.0000 mg | Freq: Four times a day (QID) | INTRAMUSCULAR | Status: AC | PRN
  Administered 2016-07-23: 30 mg via INTRAMUSCULAR

## 2016-07-23 MED ORDER — NALOXONE HCL 0.4 MG/ML IJ SOLN: 0.4000 mg | INTRAMUSCULAR | Status: DC | PRN

## 2016-07-23 MED ORDER — IBUPROFEN 600 MG PO TABS
600.0000 mg | ORAL_TABLET | Freq: Four times a day (QID) | ORAL | Status: DC
  Administered 2016-07-23 – 2016-07-25 (×8): 600 mg via ORAL
  Filled 2016-07-23 (×8): qty 1

## 2016-07-23 MED ORDER — KETOROLAC TROMETHAMINE 30 MG/ML IJ SOLN: 30.0000 mg | Freq: Four times a day (QID) | INTRAMUSCULAR | Status: AC | PRN

## 2016-07-23 MED ORDER — SERTRALINE HCL 50 MG PO TABS
50.0000 mg | ORAL_TABLET | Freq: Every day | ORAL | Status: DC
  Administered 2016-07-24: 50 mg via ORAL
  Filled 2016-07-23 (×3): qty 1

## 2016-07-23 MED ORDER — ONDANSETRON HCL 4 MG/2ML IJ SOLN
INTRAMUSCULAR | Status: DC | PRN
  Administered 2016-07-23: 4 mg via INTRAVENOUS

## 2016-07-23 SURGICAL SUPPLY — 41 items
CHLORAPREP W/TINT 26ML (MISCELLANEOUS) ×3 IMPLANT
CLAMP CORD UMBIL (MISCELLANEOUS) IMPLANT
CLOTH BEACON ORANGE TIMEOUT ST (SAFETY) ×3 IMPLANT
CONTAINER PREFILL 10% NBF 15ML (MISCELLANEOUS) IMPLANT
DRAIN JACKSON PRT FLT 7MM (DRAIN) IMPLANT
DRSG OPSITE POSTOP 4X10 (GAUZE/BANDAGES/DRESSINGS) ×3 IMPLANT
ELECT REM PT RETURN 9FT ADLT (ELECTROSURGICAL) ×3
ELECTRODE REM PT RTRN 9FT ADLT (ELECTROSURGICAL) ×1 IMPLANT
EVACUATOR SILICONE 100CC (DRAIN) IMPLANT
EXTRACTOR VACUUM M CUP 4 TUBE (SUCTIONS) IMPLANT
EXTRACTOR VACUUM M CUP 4' TUBE (SUCTIONS)
GLOVE BIOGEL PI IND STRL 7.0 (GLOVE) ×1 IMPLANT
GLOVE BIOGEL PI IND STRL 8.5 (GLOVE) ×1 IMPLANT
GLOVE BIOGEL PI INDICATOR 7.0 (GLOVE) ×2
GLOVE BIOGEL PI INDICATOR 8.5 (GLOVE) ×2
GLOVE ECLIPSE 8.0 STRL XLNG CF (GLOVE) ×6 IMPLANT
GOWN STRL REUS W/TWL LRG LVL3 (GOWN DISPOSABLE) ×9 IMPLANT
KIT ABG SYR 3ML LUER SLIP (SYRINGE) IMPLANT
NEEDLE HYPO 22GX1.5 SAFETY (NEEDLE) ×3 IMPLANT
NEEDLE HYPO 25X5/8 SAFETYGLIDE (NEEDLE) IMPLANT
PACK C SECTION WH (CUSTOM PROCEDURE TRAY) ×3 IMPLANT
PAD ABD 7.5X8 STRL (GAUZE/BANDAGES/DRESSINGS) ×3 IMPLANT
PAD OB MATERNITY 4.3X12.25 (PERSONAL CARE ITEMS) ×3 IMPLANT
PENCIL SMOKE EVAC W/HOLSTER (ELECTROSURGICAL) ×3 IMPLANT
RINGERS IRRIG 1000ML POUR BTL (IV SOLUTION) ×3 IMPLANT
SPONGE GAUZE 4X4 12PLY (GAUZE/BANDAGES/DRESSINGS) ×6 IMPLANT
SPONGE LAP 18X18 X RAY DECT (DISPOSABLE) ×3 IMPLANT
SUT MNCRL AB 3-0 PS2 27 (SUTURE) IMPLANT
SUT PLAIN 0 NONE (SUTURE) IMPLANT
SUT PLAIN 2 0 XLH (SUTURE) ×3 IMPLANT
SUT SILK 3 0 FS 1X18 (SUTURE) IMPLANT
SUT VIC AB 0 CT1 27 (SUTURE) ×4
SUT VIC AB 0 CT1 27XBRD ANBCTR (SUTURE) ×2 IMPLANT
SUT VIC AB 2-0 CTX 36 (SUTURE) ×6 IMPLANT
SUT VIC AB 3-0 CT1 27 (SUTURE)
SUT VIC AB 3-0 CT1 TAPERPNT 27 (SUTURE) IMPLANT
SUT VIC AB 3-0 SH 27 (SUTURE)
SUT VIC AB 3-0 SH 27X BRD (SUTURE) IMPLANT
SYR CONTROL 10ML LL (SYRINGE) ×3 IMPLANT
TOWEL OR 17X24 6PK STRL BLUE (TOWEL DISPOSABLE) ×3 IMPLANT
TRAY FOLEY CATH SILVER 14FR (SET/KITS/TRAYS/PACK) ×3 IMPLANT

## 2016-07-23 NOTE — Progress Notes (Signed)
I reviewed the patient's medical records from her cesarean section in 2014. At that time, she tolerated hydromorphone 2 mg every 6 hours by mouth without allergic reaction. We will use the same for pain management during this hospitalization.  Dr. Raphael Gibney 07/23/2016 11:02 AM

## 2016-07-23 NOTE — Transfer of Care (Signed)
Immediate Anesthesia Transfer of Care Note  Patient: Alison Kelly  Procedure(s) Performed: Procedure(s) with comments: CESAREAN SECTION (N/A) - RNFA requested Nira Conn K  Patient Location: PACU  Anesthesia Type:Spinal  Level of Consciousness: awake, alert , oriented and patient cooperative  Airway & Oxygen Therapy: Patient Spontanous Breathing  Post-op Assessment: Report given to RN  Post vital signs: Reviewed and stable  Last Vitals:  Vitals:   07/23/16 0822  BP: 127/88  Pulse: 80  Resp: 18  Temp: 36.4 C    Last Pain:  Vitals:   07/23/16 0822  TempSrc: Oral  PainSc: 3       Patients Stated Pain Goal: 3 ([id number])  Complications: No apparent anesthesia complications

## 2016-07-23 NOTE — Anesthesia Procedure Notes (Signed)
Spinal  Patient location during procedure: OR Staffing Anesthesiologist: Catalina Gravel Performed: anesthesiologist  Preanesthetic Checklist Completed: patient identified, surgical consent, pre-op evaluation, timeout performed, IV checked, risks and benefits discussed and monitors and equipment checked Spinal Block Patient position: sitting Prep: site prepped and draped and DuraPrep Patient monitoring: continuous pulse ox and blood pressure Approach: midline Location: L3-4 Injection technique: single-shot Needle Needle type: Pencan  Needle gauge: 24 G Needle length: 9 cm Assessment Sensory level: T6 Additional Notes Functioning IV was confirmed and monitors were applied. Sterile prep and drape, including hand hygiene, mask and sterile gloves were used. The patient was positioned and the spine was prepped. The skin was anesthetized with lidocaine.  Free flow of clear CSF was obtained prior to injecting local anesthetic into the CSF.  The spinal needle aspirated freely following injection.  The needle was carefully withdrawn.  The patient tolerated the procedure well. Consent was obtained prior to procedure with all questions answered and concerns addressed. Risks including but not limited to bleeding, infection, nerve damage, paralysis, failed block, inadequate analgesia, allergic reaction, high spinal, itching and headache were discussed and the patient wished to proceed.   Hoy Morn, MD

## 2016-07-23 NOTE — Lactation Note (Signed)
This note was copied from a baby's chart. Lactation Consultation Note  Baby 3 hours old and latched independently during consult STS. Mother states she was unable to breastfeed her first child for 2 weeks due to difficult latch and then bf and pumped until 16 months old Encouraged mother and explained every baby is different. Mother worried because L nipple seems slightly flatter.  Provided her w/ manual pump and encouraged hand expression and prepump if needed. Mom encouraged to feed baby 8-12 times/24 hours and with feeding cues. Described cluster feeding. Mom made aware of O/P services, breastfeeding support groups, community resources, and our phone # for post-discharge questions.      Patient Name: Alison Kelly [id number] Date: 07/23/2016 Reason for consult: Initial assessment   Maternal Data Has patient been taught Hand Expression?: Yes Does the patient have breastfeeding experience prior to this delivery?: Yes  Feeding Feeding Type: Breast Fed Length of feed: 5 min  LATCH Score/Interventions Latch: Repeated attempts needed to sustain latch, nipple held in mouth throughout feeding, stimulation needed to elicit sucking reflex. Intervention(s): Adjust position;Assist with latch;Breast compression  Audible Swallowing: A few with stimulation Intervention(s): Alternate breast massage;Skin to skin  Type of Nipple: Everted at rest and after stimulation  Comfort (Breast/Nipple): Soft / non-tender     Hold (Positioning): No assistance needed to correctly position infant at breast. Intervention(s): Breastfeeding basics reviewed;Support Pillows;Position options;Skin to skin  LATCH Score: 8  Lactation Tools Discussed/Used     Consult Status Consult Status: Follow-up Date: 07/24/16 Follow-up type: In-patient    Vivianne Master [facility] 07/23/2016, 1:24 PM

## 2016-07-23 NOTE — Lactation Note (Addendum)
This note was copied from a baby's chart. Lactation Consultation Note  Patient Name: Alison Kelly [id number] Date: 07/23/2016   Mom typically takes Luvox for OCD, but was not on it during pregnancy. She is ready to restart it as soon as possible (in lieu of Zoloft).  Luvox is an L2; Mom informed that it has a good safety profile. Mom plans to talk to MD in morning.   Mom also on Synthroid (L1).  Matthias Hughs [facility] 07/23/2016, 11:16 PM

## 2016-07-23 NOTE — Anesthesia Preprocedure Evaluation (Signed)
Anesthesia Evaluation  Patient identified by MRN, date of birth, ID band Patient awake    Reviewed: Allergy & Precautions, NPO status , Patient's Chart, lab work & pertinent test results  Airway Mallampati: II  TM Distance: >3 FB Neck ROM: Full    Dental  (+) Teeth Intact, Dental Advisory Given   Pulmonary neg pulmonary ROS,    Pulmonary exam normal breath sounds clear to auscultation       Cardiovascular Exercise Tolerance: Good negative cardio ROS Normal cardiovascular exam Rhythm:Regular Rate:Normal     Neuro/Psych PSYCHIATRIC DISORDERS Anxiety Depression negative neurological ROS     GI/Hepatic negative GI ROS, Neg liver ROS,   Endo/Other  Hypothyroidism Obesity   Renal/GU negative Renal ROS     Musculoskeletal  (+) Arthritis , Rheumatoid disorders,    Abdominal   Peds  Hematology negative hematology ROS (+) Plt 211k   Anesthesia Other Findings Day of surgery medications reviewed with the patient.  Reproductive/Obstetrics                             Anesthesia Physical Anesthesia Plan  ASA: II  Anesthesia Plan: Spinal   Post-op Pain Management:    Induction:   Airway Management Planned:   Additional Equipment:   Intra-op Plan:   Post-operative Plan:   Informed Consent: I have reviewed the patients History and Physical, chart, labs and discussed the procedure including the risks, benefits and alternatives for the proposed anesthesia with the patient or authorized representative who has indicated his/her understanding and acceptance.   Dental advisory given  Plan Discussed with: CRNA, Anesthesiologist and Surgeon  Anesthesia Plan Comments: (Discussed risks and benefits of and differences between spinal and general. Discussed risks of spinal including headache, backache, failure, bleeding, infection, and nerve damage. Patient consents to spinal. Questions answered.  Coagulation studies and platelet count acceptable.)        Anesthesia Quick Evaluation

## 2016-07-23 NOTE — Anesthesia Postprocedure Evaluation (Signed)
Anesthesia Post Note  Patient: Alison Kelly  Procedure(s) Performed: Procedure(s) (LRB): CESAREAN SECTION (N/A)  Patient location during evaluation: PACU Anesthesia Type: Spinal Level of consciousness: oriented and awake and alert Pain management: pain level controlled Vital Signs Assessment: post-procedure vital signs reviewed and stable Respiratory status: spontaneous breathing, respiratory function stable and patient connected to nasal cannula oxygen Cardiovascular status: blood pressure returned to baseline and stable Postop Assessment: no headache, no backache and spinal receding Anesthetic complications: no     Last Vitals:  Vitals:   07/23/16 1150 07/23/16 1215  BP: 112/61 118/60  Pulse: 68 62  Resp: 18 18  Temp: 36.8 C 36.8 C    Last Pain:  Vitals:   07/23/16 1215  TempSrc: Oral  PainSc: 3    Pain Goal: Patients Stated Pain Goal: 3 (07/23/16 [id number])               Catalina Gravel

## 2016-07-23 NOTE — Op Note (Signed)
OPERATIVE NOTE  Patient's Name: Alison Kelly  Date of Birth: 1986-05-01   Medical Records Number: [id number]   Date of Operation: 07/23/2016   Preoperative diagnosis:  [redacted]w[redacted]d weeks gestation  Prior Cesarean Section  Desires Repeat Cesarean Section  Obsessive-compulsive disorder  Anxiety  Postoperative diagnosis:  [redacted]w[redacted]d weeks gestation  Prior Cesarean Section  Desires Repeat Cesarean Section  Obsessive-compulsive disorder  Anxiety  Procedure:  Repeat low transverse cesarean section  Surgeon:  Gildardo Cranker, M.D.  Assistant:  RNFA  Anesthesia:  Regional  Disposition:  Alison Kelly is a 30 y.o. female, [redacted]w[redacted]d, who presents at [redacted]w[redacted]d weeks gestation. The patient has been followed at the [facility] obstetrics and gynecology division of [place] [facility]. This pregnancy has been complicated by a prior cesarean section. The patient desires a repeat cesarean section. She understands the indications for her procedure and she accepts the risk of, but not limited to, anesthetic complications, bleeding, infections, and possible damage to the surrounding organs.  Findings:  A  female Broadus John) was delivered from a left occiput transverse position.  The Apgar scores were 8 / 9. The uterus, fallopian tubes, and ovaries were normal for the gravid state.  Procedure:  The patient was taken to the operating room where a spinal anesthetic was given.The perineum was prepped with betadine. A Foley catheter was placed in the bladder.The patient's abdomen was prepped with Duraprep.   The patient was sterilely draped. A "timeout" was performed which properly identified the patient and the correct operative procedure. The lower abdomen was injected with half percent Marcaine with epinephrine. A low transverse incision was made in the abdomen and carried sharply through the subcutaneous tissue, the fascia, and the anterior peritoneum. An incision was made  in the lower uterine segment. The incision was extended in a low transverse fashion. The membranes were ruptured. The fetal head was delivered with a Kiwi vacuum extractor. The mouth and nose were suctioned. The remainder of the infant was then delivered. The cord was clamped and cut. The infant was handed to the awaiting pediatric team. The placenta was removed. The uterine cavity was cleaned of amniotic fluid, clotted blood, and membranes. The uterine incision was closed using a running locking suture of 2-0 Vicryl. An imbricating suture of 2-0 Vicryl was placed. The pelvis was vigorously irrigated. Hemostasis was adequate. The anterior peritoneum and the abdominal musculature were closed using 2-0 Vicryl. The fascia was closed using a running suture of 0 Vicryl followed by 3 interrupted sutures of 0 Vicryl. The subcutaneous layer was closed using interrupted sutures of 2-0 Vicryl. The skin was reapproximated using a subcuticular suture of 3-0 Monocryl. Sponge, needle, and instrument counts were correct on 2 occasions. The estimated blood loss for the procedure was 800 cc. The patient tolerated her procedure well. She was transported to the recovery room in stable condition. The infant remained in the operating room with the mother for bonding. The placenta was sent to labor and delivery.  Gildardo Cranker, M.D. 07/23/2016

## 2016-07-23 NOTE — Progress Notes (Signed)
The patient was interviewed and examined today.  The previously documented history and physical examination was reviewed. There are no changes. The operative procedure was reviewed. The risks and benefits were outlined again. The specific risks include, but are not limited to, anesthetic complications, bleeding, infections, and possible damage to the surrounding organs. The patient's questions were answered.  We are ready to proceed as outlined. The likelihood of the patient achieving the goals of this procedure is very likely.   Alison Hyneman Vernon Deacon Kelly, M.D.  

## 2016-07-24 LAB — CBC
HCT: 27.5 % — ABNORMAL LOW (ref 36.0–46.0)
HEMOGLOBIN: 9.2 g/dL — AB (ref 12.0–15.0)
MCH: 29.2 pg (ref 26.0–34.0)
MCHC: 33.5 g/dL (ref 30.0–36.0)
MCV: 87.3 fL (ref 78.0–100.0)
Platelets: 151 10*3/uL (ref 150–400)
RBC: 3.15 MIL/uL — ABNORMAL LOW (ref 3.87–5.11)
RDW: 15.1 % (ref 11.5–15.5)
WBC: 9.7 10*3/uL (ref 4.0–10.5)

## 2016-07-24 LAB — BIRTH TISSUE RECOVERY COLLECTION (PLACENTA DONATION)

## 2016-07-24 MED ORDER — FLUVOXAMINE MALEATE 50 MG PO TABS
150.0000 mg | ORAL_TABLET | Freq: Every day | ORAL | Status: DC
  Filled 2016-07-24 (×2): qty 1

## 2016-07-24 MED ORDER — FERROUS SULFATE 325 (65 FE) MG PO TABS
325.0000 mg | ORAL_TABLET | Freq: Two times a day (BID) | ORAL | Status: DC
  Administered 2016-07-25: 325 mg via ORAL
  Filled 2016-07-24: qty 1

## 2016-07-24 NOTE — Lactation Note (Signed)
This note was copied from a baby's chart. Lactation Consultation Note  Patient Name: Alison Kelly [id number] Date: 07/24/2016 Reason for consult: Follow-up assessment  Mom c/o latch that subsides, but may last longer than typical with initial latch. Mom's nipples visualized. Nipples pink, L nipple w/very slight scabbing noted in shape of compression stripe. "Alison Kelly" was assisted to breast. He latched w/relative ease. Mom did not feel discomfort. Alison Kelly is nursing very well.  Mom reports that with her 1st child, she could pump 5 oz/session after 2 weeks postpartum. Lactation to f/u tomorrow.    Alison Kelly [facility] 07/24/2016, 11:11 PM

## 2016-07-24 NOTE — Progress Notes (Signed)
CSW met with pt/family to discuss her hx of OCD/anxiety/depression.  Permission granted to discuss hx with family members present.  Mom states that she was prescribed Zoloft around her 5th month of pregnancy, but plans on re-starting her Luvox now that she has delivered.  Pt sees "Stanton Kidney" at [facility] for therapy and plans on continuing therapy, post-partum.  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  Per pt, she and her therapist have already been discussing PPD. Support group information provided. No other social work needs identified.  CSW signing off.  Please re-consult, as necessary.  Creta Levin, LCSW [employer] [phone]

## 2016-07-24 NOTE — Progress Notes (Signed)
Subjective: Postpartum Day 1: Cesarean Delivery  Patient reports tolerating PO.    Objective: Vital signs in last 24 hours: Temp:  [97.6 F (36.4 C)-98.4 F (36.9 C)] 97.8 F (36.6 C) (08/12 1200) Pulse Rate:  [61-73] 68 (08/12 1200) Resp:  [18-20] 18 (08/12 1200) BP: (92-112)/(51-62) 95/55 (08/12 1200) SpO2:  [98 %-100 %] 99 % (08/12 0755)  Physical Exam:  General: alert and no distress Lochia: appropriate Uterine Fundus: firm Incision: Dressing clean and dry DVT Evaluation: No evidence of DVT seen on physical exam.   Recent Labs  07/24/16 0543  HGB 9.2*  HCT 27.5*    Assessment/Plan:  Status post Cesarean section. Doing well postoperatively.  Continue current care.  Begin Luvox 150 mg daily.  Home tomorrow.  Circ discussed.  Eli Hose 07/24/2016, 2:37 PM

## 2016-07-24 NOTE — Discharge Summary (Signed)
[facility] Discharge Summary   Patient Name:   Alison Kelly DOB:     1986-12-13 MRN:     [id number]  Date of Admission:   07/23/2016 Date of Discharge:  07/25/2016  Admitting diagnosis:    Term Pregnancy  Prior Cesarean Section  Anemia  Desires Repeat Cesarean Section  OCD     Discharge diagnosis:   Same  Now delivered                                                                       Post partum procedures: None  Type of Delivery:  Repeat c-section  Delivering Provider: [name]   Date of Delivery:  07/23/2016  Newborn Data:    Live born female  Birth Weight: 7 lb 5.1 oz (3320 g) APGARS: 59, 74  Baby's Name:  Alison Kelly Feeding:   Breast Disposition:   home with mother  Complications:   None  Hospital course:      Sceduled C/S   30 y.o. yo G2P2002 at 109w1d was admitted to the hospital 07/23/2016 for scheduled cesarean section with the following indication:Elective Repeat.  Membrane Rupture Time/Date: 9:54 AM ,07/23/2016   Patient delivered a Viable infant.07/23/2016  Details of operation can be found in separate operative note.  Pateint had an uncomplicated postpartum course.  She is ambulating, tolerating a regular diet, passing flatus, and urinating well. Patient is discharged home in stable condition on  07/25/16          Physical Exam:   Vitals:   07/24/16 0755 07/24/16 1200 07/24/16 1817 07/25/16 0618  BP: (!) 92/51 (!) 95/55 106/65 117/70  Pulse: 61 68 67 73  Resp: 18 18 18 18   Temp: 98 F (36.7 C) 97.8 F (36.6 C) 97.7 F (36.5 C) 98.3 F (36.8 C)  TempSrc: Oral Oral Oral Oral  SpO2: 99%  98%    General: alert, cooperative and no distress Lochia: appropriate Uterine Fundus: firm Incision: Healing well with no significant drainage, No significant erythema, Dressing is clean, dry, and intact DVT Evaluation: No evidence of DVT seen on physical exam. Negative Homan's sign. No cords or calf tenderness. No  significant calf/ankle edema.  Labs: Lab Results  Component Value Date   WBC 9.7 07/24/2016   HGB 9.2 (L) 07/24/2016   HCT 27.5 (L) 07/24/2016   MCV 87.3 07/24/2016   PLT 151 07/24/2016    Discharge instruction: per After Visit Summary and "Baby and Me Booklet".  After Visit Meds:    Medication List    STOP taking these medications   sertraline 50 MG tablet Commonly known as:  ZOLOFT     TAKE these medications   cholecalciferol 1000 units tablet Commonly known as:  VITAMIN D Take 4,000 Units by mouth daily.   fluvoxaMINE 50 MG tablet Commonly known as:  LUVOX Take 3 tablets (150 mg total) by mouth at bedtime.   HYDROmorphone 2 MG tablet Commonly known as:  DILAUDID Take 1 tablet (2 mg total) by mouth every 4 (four) hours as needed for severe pain.   ibuprofen 800 MG tablet Commonly known as:  ADVIL,MOTRIN Take 1 tablet (800 mg total) by mouth every 8 (eight) hours as needed.   levothyroxine 25 MCG  tablet Commonly known as:  SYNTHROID, LEVOTHROID Take 1 tablet (25 mcg total) by mouth daily before breakfast.   prenatal multivitamin Tabs tablet Take 1 tablet by mouth daily.       Diet: routine diet  Activity: Advance as tolerated. Pelvic rest for 6 weeks.   Outpatient follow up:6 weeks Follow up Appt: Future Appointments Date Time Provider [place]  11/29/2016 9:30 AM [name], MD [facility] None    Postpartum contraception: Spouse planning Vasectomy  07/25/2016 Farrel Gordon, CNM

## 2016-07-25 MED ORDER — IBUPROFEN 800 MG PO TABS: 800.0000 mg | ORAL_TABLET | Freq: Three times a day (TID) | ORAL | 1 refills | Status: DC | PRN

## 2016-07-25 MED ORDER — HYDROMORPHONE HCL 2 MG PO TABS: 2.0000 mg | ORAL_TABLET | ORAL | 0 refills | Status: DC | PRN

## 2016-07-25 MED ORDER — FLUVOXAMINE MALEATE 50 MG PO TABS: 150.0000 mg | ORAL_TABLET | Freq: Every day | ORAL | 0 refills | Status: DC

## 2016-07-25 MED ORDER — FLUVOXAMINE MALEATE 50 MG PO TABS: 50.0000 mg | ORAL_TABLET | Freq: Every day | ORAL | 2 refills | Status: DC

## 2016-07-25 NOTE — Discharge Instructions (Signed)
Hypothyroidism Hypothyroidism is a disorder of the thyroid. The thyroid is a large gland that is located in the lower front of the neck. The thyroid releases hormones that control how the body works. With hypothyroidism, the thyroid does not make enough of these hormones. CAUSES Causes of hypothyroidism may include:  Viral infections.  Pregnancy.  Your own defense system (immune system) attacking your thyroid.  Certain medicines.  Birth defects.  Past radiation treatments to your head or neck.  Past treatment with radioactive iodine.  Past surgical removal of part or all of your thyroid.  Problems with the gland that is located in the center of your brain (pituitary). SIGNS AND SYMPTOMS Signs and symptoms of hypothyroidism may include:  Feeling as though you have no energy (lethargy).  Inability to tolerate cold.  Weight gain that is not explained by a change in diet or exercise habits.  Dry skin.  Coarse hair.  Menstrual irregularity.  Slowing of thought processes.  Constipation.  Sadness or depression. DIAGNOSIS  Your health care provider may diagnose hypothyroidism with blood tests and ultrasound tests. TREATMENT Hypothyroidism is treated with medicine that replaces the hormones that your body does not make. After you begin treatment, it may take several weeks for symptoms to go away. HOME CARE INSTRUCTIONS   Take medicines only as directed by your health care provider.  If you start taking any new medicines, tell your health care provider.  Keep all follow-up visits as directed by your health care provider. This is important. As your condition improves, your dosage needs may change. You will need to have blood tests regularly so that your health care provider can watch your condition. SEEK MEDICAL CARE IF:  Your symptoms do not get better with treatment.  You are taking thyroid replacement medicine and:  You sweat excessively.  You have tremors.  You  feel anxious.  You lose weight rapidly.  You cannot tolerate heat.  You have emotional swings.  You have diarrhea.  You feel weak. SEEK IMMEDIATE MEDICAL CARE IF:   You develop chest pain.  You develop an irregular heartbeat.  You develop a rapid heartbeat.   This information is not intended to replace advice given to you by your health care provider. Make sure you discuss any questions you have with your health care provider.   Document Released: 11/29/2005 Document Revised: 12/20/2014 Document Reviewed: 04/16/2014 Elsevier Interactive Patient Education 2016 [employer]   Breastfeeding and Mastitis Mastitis is inflammation of the breast tissue. It can occur in women who are breastfeeding. This can make breastfeeding painful. Mastitis will sometimes go away on its own. Your health care provider will help determine if treatment is needed. CAUSES Mastitis is often associated with a blocked milk (lactiferous) duct. This can happen when too much milk builds up in the breast. Causes of excess milk in the breast can include:  Poor latch-on. If your baby is not latched onto the breast properly, she or he may not empty your breast completely while breastfeeding.  Allowing too much time to pass between feedings.  Wearing a bra or other clothing that is too tight. This puts extra pressure on the lactiferous ducts so milk does not flow through them as it should. Mastitis can also be caused by a bacterial infection. Bacteria may enter the breast tissue through cuts or openings in the skin. In women who are breastfeeding, this may occur because of cracked or irritated skin. Cracks in the skin are often caused when your baby  does not latch on properly to the breast. SIGNS AND SYMPTOMS  Swelling, redness, tenderness, and pain in an area of the breast.  Swelling of the glands under the arm on the same side.  Fever may or may not accompany mastitis. If an infection is allowed to progress,  a collection of pus (abscess) may develop. DIAGNOSIS  Your health care provider can usually diagnose mastitis based on your symptoms and a physical exam. Tests may be done to help confirm the diagnosis. These may include:  Removal of pus from the breast by applying pressure to the area. This pus can be examined in the lab to determine which bacteria are present. If an abscess has developed, the fluid in the abscess can be removed with a needle. This can also be used to confirm the diagnosis and determine the bacteria present. In most cases, pus will not be present.  Blood tests to determine if your body is fighting a bacterial infection.  Mammogram or ultrasound tests to rule out other problems or diseases. TREATMENT  Mastitis that occurs with breastfeeding will sometimes go away on its own. Your health care provider may choose to wait 24 hours after first seeing you to decide whether a prescription medicine is needed. If your symptoms are worse after 24 hours, your health care provider will likely prescribe an antibiotic medicine to treat the mastitis. He or she will determine which bacteria are most likely causing the infection and will then select an appropriate antibiotic medicine. This is sometimes changed based on the results of tests performed to identify the bacteria, or if there is no response to the antibiotic medicine selected. Antibiotic medicines are usually given by mouth. You may also be given medicine for pain. HOME CARE INSTRUCTIONS  Only take over-the-counter or prescription medicines for pain, fever, or discomfort as directed by your health care provider.  If your health care provider prescribed an antibiotic medicine, take the medicine as directed. Make sure you finish it even if you start to feel better.  Do not wear a tight or underwire bra. Wear a soft, supportive bra.  Increase your fluid intake, especially if you have a fever.  Continue to empty the breast. Your health  care provider can tell you whether this milk is safe for your infant or needs to be thrown out. You may be told to stop nursing until your health care provider thinks it is safe for your baby. Use a breast pump if you are advised to stop nursing.  Keep your nipples clean and dry.  Empty the first breast completely before going to the other breast. If your baby is not emptying your breasts completely for some reason, use a breast pump to empty your breasts.  If you go back to work, pump your breasts while at work to stay in time with your nursing schedule.  Avoid allowing your breasts to become overly filled with milk (engorged). SEEK MEDICAL CARE IF:  You have pus-like discharge from the breast.  Your symptoms do not improve with the treatment prescribed by your health care provider within 2 days. SEEK IMMEDIATE MEDICAL CARE IF:  Your pain and swelling are getting worse.  You have pain that is not controlled with medicine.  You have a red line extending from the breast toward your armpit.  You have a fever or persistent symptoms for more than 2-3 days.  You have a fever and your symptoms suddenly get worse. MAKE SURE YOU:   Understand these instructions.  Will watch your condition.  Will get help right away if you are not doing well or get worse.   This information is not intended to replace advice given to you by your health care provider. Make sure you discuss any questions you have with your health care provider.   Document Released: 03/26/2005 Document Revised: 12/04/2013 Document Reviewed: 07/05/2013 Elsevier Interactive Patient Education [employer]. Breastfeeding Deciding to breastfeed is one of the best choices you can make for you and your baby. A change in hormones during pregnancy causes your breast tissue to grow and increases the number and size of your milk ducts. These hormones also allow proteins, sugars, and fats from your blood supply to make breast milk  in your milk-producing glands. Hormones prevent breast milk from being released before your baby is born as well as prompt milk flow after birth. Once breastfeeding has begun, thoughts of your baby, as well as his or her sucking or crying, can stimulate the release of milk from your milk-producing glands.  BENEFITS OF BREASTFEEDING For Your Baby  Your first milk (colostrum) helps your baby's digestive system function better.  There are antibodies in your milk that help your baby fight off infections.  Your baby has a lower incidence of asthma, allergies, and sudden infant death syndrome.  The nutrients in breast milk are better for your baby than infant formulas and are designed uniquely for your baby's needs.  Breast milk improves your baby's brain development.  Your baby is less likely to develop other conditions, such as childhood obesity, asthma, or type 2 diabetes mellitus. For You  Breastfeeding helps to create a very special bond between you and your baby.  Breastfeeding is convenient. Breast milk is always available at the correct temperature and costs nothing.  Breastfeeding helps to burn calories and helps you lose the weight gained during pregnancy.  Breastfeeding makes your uterus contract to its prepregnancy size faster and slows bleeding (lochia) after you give birth.   Breastfeeding helps to lower your risk of developing type 2 diabetes mellitus, osteoporosis, and breast or ovarian cancer later in life. SIGNS THAT YOUR BABY IS HUNGRY Early Signs of Hunger  Increased alertness or activity.  Stretching.  Movement of the head from side to side.  Movement of the head and opening of the mouth when the corner of the mouth or cheek is stroked (rooting).  Increased sucking sounds, smacking lips, cooing, sighing, or squeaking.  Hand-to-mouth movements.  Increased sucking of fingers or hands. Late Signs of Hunger  Fussing.  Intermittent crying. Extreme Signs of  Hunger Signs of extreme hunger will require calming and consoling before your baby will be able to breastfeed successfully. Do not wait for the following signs of extreme hunger to occur before you initiate breastfeeding:  Restlessness.  A loud, strong cry.  Screaming. BREASTFEEDING BASICS Breastfeeding Initiation  Find a comfortable place to sit or lie down, with your neck and back well supported.  Place a pillow or rolled up blanket under your baby to bring him or her to the level of your breast (if you are seated). Nursing pillows are specially designed to help support your arms and your baby while you breastfeed.  Make sure that your baby's abdomen is facing your abdomen.  Gently massage your breast. With your fingertips, massage from your chest wall toward your nipple in a circular motion. This encourages milk flow. You may need to continue this action during the feeding if your milk flows slowly.  Support your breast with 4 fingers underneath and your thumb above your nipple. Make sure your fingers are well away from your nipple and your baby's mouth.  Stroke your baby's lips gently with your finger or nipple.  When your baby's mouth is open wide enough, quickly bring your baby to your breast, placing your entire nipple and as much of the colored area around your nipple (areola) as possible into your baby's mouth.  More areola should be visible above your baby's upper lip than below the lower lip.  Your baby's tongue should be between his or her lower gum and your breast.  Ensure that your baby's mouth is correctly positioned around your nipple (latched). Your baby's lips should create a seal on your breast and be turned out (everted).  It is common for your baby to suck about 2-3 minutes in order to start the flow of breast milk. Latching Teaching your baby how to latch on to your breast properly is very important. An improper latch can cause nipple pain and decreased milk  supply for you and poor weight gain in your baby. Also, if your baby is not latched onto your nipple properly, he or she may swallow some air during feeding. This can make your baby fussy. Burping your baby when you switch breasts during the feeding can help to get rid of the air. However, teaching your baby to latch on properly is still the best way to prevent fussiness from swallowing air while breastfeeding. Signs that your baby has successfully latched on to your nipple:  Silent tugging or silent sucking, without causing you pain.  Swallowing heard between every 3-4 sucks.  Muscle movement above and in front of his or her ears while sucking. Signs that your baby has not successfully latched on to nipple:  Sucking sounds or smacking sounds from your baby while breastfeeding.  Nipple pain. If you think your baby has not latched on correctly, slip your finger into the corner of your baby's mouth to break the suction and place it between your baby's gums. Attempt breastfeeding initiation again. Signs of Successful Breastfeeding Signs from your baby:  A gradual decrease in the number of sucks or complete cessation of sucking.  Falling asleep.  Relaxation of his or her body.  Retention of a small amount of milk in his or her mouth.  Letting go of your breast by himself or herself. Signs from you:  Breasts that have increased in firmness, weight, and size 1-3 hours after feeding.  Breasts that are softer immediately after breastfeeding.  Increased milk volume, as well as a change in milk consistency and color by the fifth day of breastfeeding.  Nipples that are not sore, cracked, or bleeding. Signs That Your [employer] is Getting Enough Milk  Wetting at least 3 diapers in a 24-hour period. The urine should be clear and pale yellow by age 30 days.  At least 3 stools in a 24-hour period by age 30 days. The stool should be soft and yellow.  At least 3 stools in a 24-hour period by age 75  days. The stool should be seedy and yellow.  No loss of weight greater than 10% of birth weight during the first 37 days of age.  Average weight gain of 4-7 ounces (113-198 g) per week after age 71 days.  Consistent daily weight gain by age 308 days, without weight loss after the age of 2 weeks. After a feeding, your baby may spit up a small amount.  This is common. BREASTFEEDING FREQUENCY AND DURATION Frequent feeding will help you make more milk and can prevent sore nipples and breast engorgement. Breastfeed when you feel the need to reduce the fullness of your breasts or when your baby shows signs of hunger. This is called "breastfeeding on demand." Avoid introducing a pacifier to your baby while you are working to establish breastfeeding (the first 4-6 weeks after your baby is born). After this time you may choose to use a pacifier. Research has shown that pacifier use during the first year of a baby's life decreases the risk of sudden infant death syndrome (SIDS). Allow your baby to feed on each breast as long as he or she wants. Breastfeed until your baby is finished feeding. When your baby unlatches or falls asleep while feeding from the first breast, offer the second breast. Because newborns are often sleepy in the first few weeks of life, you may need to awaken your baby to get him or her to feed. Breastfeeding times will vary from baby to baby. However, the following rules can serve as a guide to help you ensure that your baby is properly fed:  Newborns (babies 51 weeks of age or younger) may breastfeed every 1-3 hours.  Newborns should not go longer than 3 hours during the day or 5 hours during the night without breastfeeding.  You should breastfeed your baby a minimum of 8 times in a 24-hour period until you begin to introduce solid foods to your baby at around 30 months of age. BREAST MILK PUMPING Pumping and storing breast milk allows you to ensure that your baby is exclusively fed your  breast milk, even at times when you are unable to breastfeed. This is especially important if you are going back to work while you are still breastfeeding or when you are not able to be present during feedings. Your lactation consultant can give you guidelines on how long it is safe to store breast milk. A breast pump is a machine that allows you to pump milk from your breast into a sterile bottle. The pumped breast milk can then be stored in a refrigerator or freezer. Some breast pumps are operated by hand, while others use electricity. Ask your lactation consultant which type will work best for you. Breast pumps can be purchased, but some hospitals and breastfeeding support groups lease breast pumps on a monthly basis. A lactation consultant can teach you how to hand express breast milk, if you prefer not to use a pump. CARING FOR YOUR BREASTS WHILE YOU BREASTFEED Nipples can become dry, cracked, and sore while breastfeeding. The following recommendations can help keep your breasts moisturized and healthy:  Avoid using soap on your nipples.  Wear a supportive bra. Although not required, special nursing bras and tank tops are designed to allow access to your breasts for breastfeeding without taking off your entire bra or top. Avoid wearing underwire-style bras or extremely tight bras.  Air dry your nipples for 3-54mnutes after each feeding.  Use only cotton bra pads to absorb leaked breast milk. Leaking of breast milk between feedings is normal.  Use lanolin on your nipples after breastfeeding. Lanolin helps to maintain your skin's normal moisture barrier. If you use pure lanolin, you do not need to wash it off before feeding your baby again. Pure lanolin is not toxic to your baby. You may also hand express a few drops of breast milk and gently massage that milk into your nipples and allow the milk  to air dry. In the first few weeks after giving birth, some women experience extremely full breasts  (engorgement). Engorgement can make your breasts feel heavy, warm, and tender to the touch. Engorgement peaks within 3-5 days after you give birth. The following recommendations can help ease engorgement:  Completely empty your breasts while breastfeeding or pumping. You may want to start by applying warm, moist heat (in the shower or with warm water-soaked hand towels) just before feeding or pumping. This increases circulation and helps the milk flow. If your baby does not completely empty your breasts while breastfeeding, pump any extra milk after he or she is finished.  Wear a snug bra (nursing or regular) or tank top for 1-2 days to signal your body to slightly decrease milk production.  Apply ice packs to your breasts, unless this is too uncomfortable for you.  Make sure that your baby is latched on and positioned properly while breastfeeding. If engorgement persists after 48 hours of following these recommendations, contact your health care provider or a Science writer. OVERALL HEALTH CARE RECOMMENDATIONS WHILE BREASTFEEDING  Eat healthy foods. Alternate between meals and snacks, eating 3 of each per day. Because what you eat affects your breast milk, some of the foods may make your baby more irritable than usual. Avoid eating these foods if you are sure that they are negatively affecting your baby.  Drink milk, fruit juice, and water to satisfy your thirst (about 10 glasses a day).  Rest often, relax, and continue to take your prenatal vitamins to prevent fatigue, stress, and anemia.  Continue breast self-awareness checks.  Avoid chewing and smoking tobacco. Chemicals from cigarettes that pass into breast milk and exposure to secondhand smoke may harm your baby.  Avoid alcohol and drug use, including marijuana. Some medicines that may be harmful to your baby can pass through breast milk. It is important to ask your health care provider before taking any medicine, including all  over-the-counter and prescription medicine as well as vitamin and herbal supplements. It is possible to become pregnant while breastfeeding. If birth control is desired, ask your health care provider about options that will be safe for your baby. SEEK MEDICAL CARE IF:  You feel like you want to stop breastfeeding or have become frustrated with breastfeeding.  You have painful breasts or nipples.  Your nipples are cracked or bleeding.  Your breasts are red, tender, or warm.  You have a swollen area on either breast.  You have a fever or chills.  You have nausea or vomiting.  You have drainage other than breast milk from your nipples.  Your breasts do not become full before feedings by the fifth day after you give birth.  You feel sad and depressed.  Your baby is too sleepy to eat well.  Your baby is having trouble sleeping.   Your baby is wetting less than 3 diapers in a 24-hour period.  Your baby has less than 3 stools in a 24-hour period.  Your baby's skin or the white part of his or her eyes becomes yellow.   Your baby is not gaining weight by 57 days of age. SEEK IMMEDIATE MEDICAL CARE IF:  Your baby is overly tired (lethargic) and does not want to wake up and feed.  Your baby develops an unexplained fever.   This information is not intended to replace advice given to you by your health care provider. Make sure you discuss any questions you have with your health care provider.  Document Released: 11/29/2005 Document Revised: 08/20/2015 Document Reviewed: 05/23/2013 Elsevier Interactive Patient Education 2016 [employer]. Anemia, Nonspecific Anemia is a condition in which the concentration of red blood cells or hemoglobin in the blood is below normal. Hemoglobin is a substance in red blood cells that carries oxygen to the tissues of the body. Anemia results in not enough oxygen reaching these tissues.  CAUSES  Common causes of anemia include:   Excessive  bleeding. Bleeding may be internal or external. This includes excessive bleeding from periods (in women) or from the intestine.   Poor nutrition.   Chronic kidney, thyroid, and liver disease.  Bone marrow disorders that decrease red blood cell production.  Cancer and treatments for cancer.  HIV, AIDS, and their treatments.  Spleen problems that increase red blood cell destruction.  Blood disorders.  Excess destruction of red blood cells due to infection, medicines, and autoimmune disorders. SIGNS AND SYMPTOMS   Minor weakness.   Dizziness.   Headache.  Palpitations.   Shortness of breath, especially with exercise.   Paleness.  Cold sensitivity.  Indigestion.  Nausea.  Difficulty sleeping.  Difficulty concentrating. Symptoms may occur suddenly or they may develop slowly.  DIAGNOSIS  Additional blood tests are often needed. These help your health care provider determine the best treatment. Your health care provider will check your stool for blood and look for other causes of blood loss.  TREATMENT  Treatment varies depending on the cause of the anemia. Treatment can include:   Supplements of iron, vitamin E39, or folic acid.   Hormone medicines.   A blood transfusion. This may be needed if blood loss is severe.   Hospitalization. This may be needed if there is significant continual blood loss.   Dietary changes.  Spleen removal. HOME CARE INSTRUCTIONS Keep all follow-up appointments. It often takes many weeks to correct anemia, and having your health care provider check on your condition and your response to treatment is very important. SEEK IMMEDIATE MEDICAL CARE IF:   You develop extreme weakness, shortness of breath, or chest pain.   You become dizzy or have trouble concentrating.  You develop heavy vaginal bleeding.   You develop a rash.   You have bloody or black, tarry stools.   You faint.   You vomit up blood.   You  vomit repeatedly.   You have abdominal pain.  You have a fever or persistent symptoms for more than 2-3 days.   You have a fever and your symptoms suddenly get worse.   You are dehydrated.  MAKE SURE YOU:  Understand these instructions.  Will watch your condition.  Will get help right away if you are not doing well or get worse.   This information is not intended to replace advice given to you by your health care provider. Make sure you discuss any questions you have with your health care provider.   Document Released: 01/06/2005 Document Revised: 08/01/2013 Document Reviewed: 05/25/2013 Elsevier Interactive Patient Education 2016 [employer]. Iron-Rich Diet Iron is a mineral that helps your body to produce hemoglobin. Hemoglobin is a protein in your red blood cells that carries oxygen to your body's tissues. Eating too little iron may cause you to feel weak and tired, and it can increase your risk for infection. Eating enough iron is necessary for your body's metabolism, muscle function, and nervous system. Iron is naturally found in many foods. It can also be added to foods or fortified in foods. There are two types of dietary  iron:  Heme iron. Heme iron is absorbed by the body more easily than nonheme iron. Heme iron is found in meat, poultry, and fish.  Nonheme iron. Nonheme iron is found in dietary supplements, iron-fortified grains, beans, and vegetables. You may need to follow an iron-rich diet if:  You have been diagnosed with iron deficiency or iron-deficiency anemia.  You have a condition that prevents you from absorbing dietary iron, such as:  Infection in your intestines.  Celiac disease. This involves long-lasting (chronic) inflammation of your intestines.  You do not eat enough iron.  You eat a diet that is high in foods that impair iron absorption.  You have lost a lot of blood.  You have heavy bleeding during your menstrual cycle.  You are  pregnant. WHAT IS MY PLAN? Your health care provider may help you to determine how much iron you need per day based on your condition. Generally, when a person consumes sufficient amounts of iron in the diet, the following iron needs are met:  Men.  49-93 years old: 11 mg per day.  27-33 years old: 8 mg per day.  Women.   24-26 years old: 15 mg per day.  58-43 years old: 18 mg per day.  Over 42 years old: 8 mg per day.  Pregnant women: 27 mg per day.  Breastfeeding women: 9 mg per day. WHAT DO I NEED TO KNOW ABOUT AN IRON-RICH DIET?  Eat fresh fruits and vegetables that are high in vitamin C along with foods that are high in iron. This will help increase the amount of iron that your body absorbs from food, especially with foods containing nonheme iron. Foods that are high in vitamin C include oranges, peppers, tomatoes, and mango.  Take iron supplements only as directed by your health care provider. Overdose of iron can be life-threatening. If you were prescribed iron supplements, take them with orange juice or a vitamin C supplement.  Cook foods in pots and pans that are made from iron.   Eat nonheme iron-containing foods alongside foods that are high in heme iron. This helps to improve your iron absorption.   Certain foods and drinks contain compounds that impair iron absorption. Avoid eating these foods in the same meal as iron-rich foods or with iron supplements. These include:  Coffee, black tea, and red wine.  Milk, dairy products, and foods that are high in calcium.  Beans, soybeans, and peas.  Whole grains.  When eating foods that contain both nonheme iron and compounds that impair iron absorption, follow these tips to absorb iron better.   Soak beans overnight before cooking.  Soak whole grains overnight and drain them before using.  Ferment flours before baking, such as using yeast in bread dough. WHAT FOODS CAN I EAT? Grains Iron-fortified breakfast  cereal. Iron-fortified whole-wheat bread. Enriched rice. Sprouted grains. Vegetables Spinach. Potatoes with skin. Green peas. Broccoli. Red and green bell peppers. Fermented vegetables. Fruits Prunes. Raisins. Oranges. Strawberries. Mango. Grapefruit. Meats and Other Protein Sources Beef liver. Oysters. Beef. Shrimp. [place]. Chicken. [place]. Sardines. Chickpeas. Nuts. Tofu. Beverages Tomato juice. Fresh orange juice. Prune juice. Hibiscus tea. Fortified instant breakfast shakes. Condiments Tahini. Fermented soy sauce. Sweets and Desserts Black-strap molasses.  Other Wheat germ. The items listed above may not be a complete list of recommended foods or beverages. Contact your dietitian for more options. WHAT FOODS ARE NOT RECOMMENDED? Grains Whole grains. Bran cereal. Bran flour. Oats. Vegetables Artichokes. Brussels sprouts. Kale. Fruits Blueberries. Raspberries. Strawberries. Figs. Meats  and Other Protein Sources Soybeans. Products made from soy protein. Dairy Milk. Cream. Cheese. Yogurt. Cottage cheese. Beverages Coffee. Black tea. Red wine. Sweets and Desserts Cocoa. Chocolate. Ice cream. Other Basil. Oregano. Parsley. The items listed above may not be a complete list of foods and beverages to avoid. Contact your dietitian for more information.   This information is not intended to replace advice given to you by your health care provider. Make sure you discuss any questions you have with your health care provider.   Document Released: 07/13/2005 Document Revised: 12/20/2014 Document Reviewed: 06/26/2014 Elsevier Interactive Patient Education 2016 [employer]. Postpartum Depression and Baby Blues The postpartum period begins right after the birth of a baby. During this time, there is often a great amount of joy and excitement. It is also a time of many changes in the life of the parents. Regardless of how many times a mother gives birth, each child brings  new challenges and dynamics to the family. It is not unusual to have feelings of excitement along with confusing shifts in moods, emotions, and thoughts. All mothers are at risk of developing postpartum depression or the "baby blues." These mood changes can occur right after giving birth, or they may occur many months after giving birth. The baby blues or postpartum depression can be mild or severe. Additionally, postpartum depression can go away rather quickly, or it can be a long-term condition.  CAUSES Raised hormone levels and the rapid drop in those levels are thought to be a main cause of postpartum depression and the baby blues. A number of hormones change during and after pregnancy. Estrogen and progesterone usually decrease right after the delivery of your baby. The levels of thyroid hormone and various cortisol steroids also rapidly drop. Other factors that play a role in these mood changes include major life events and genetics.  RISK FACTORS If you have any of the following risks for the baby blues or postpartum depression, know what symptoms to watch out for during the postpartum period. Risk factors that may increase the likelihood of getting the baby blues or postpartum depression include:  Having a personal or family history of depression.   Having depression while being pregnant.   Having premenstrual mood issues or mood issues related to oral contraceptives.  Having a lot of life stress.   Having marital conflict.   Lacking a social support network.   Having a baby with special needs.   Having health problems, such as diabetes.  SIGNS AND SYMPTOMS Symptoms of baby blues include:  Brief changes in mood, such as going from extreme happiness to sadness.  Decreased concentration.   Difficulty sleeping.   Crying spells, tearfulness.   Irritability.   Anxiety.  Symptoms of postpartum depression typically begin within the first month after giving birth. These  symptoms include:  Difficulty sleeping or excessive sleepiness.   Marked weight loss.   Agitation.   Feelings of worthlessness.   Lack of interest in activity or food.  Postpartum psychosis is a very serious condition and can be dangerous. Fortunately, it is rare. Displaying any of the following symptoms is cause for immediate medical attention. Symptoms of postpartum psychosis include:   Hallucinations and delusions.   Bizarre or disorganized behavior.   Confusion or disorientation.  DIAGNOSIS  A diagnosis is made by an evaluation of your symptoms. There are no medical or lab tests that lead to a diagnosis, but there are various questionnaires that a health care provider may use to  identify those with the baby blues, postpartum depression, or psychosis. Often, a screening tool called the [place] Postnatal Depression Scale is used to diagnose depression in the postpartum period.  TREATMENT The baby blues usually goes away on its own in 1-2 weeks. Social support is often all that is needed. You will be encouraged to get adequate sleep and rest. Occasionally, you may be given medicines to help you sleep.  Postpartum depression requires treatment because it can last several months or longer if it is not treated. Treatment may include individual or group therapy, medicine, or both to address any social, physiological, and psychological factors that may play a role in the depression. Regular exercise, a healthy diet, rest, and social support may also be strongly recommended.  Postpartum psychosis is more serious and needs treatment right away. Hospitalization is often needed. HOME CARE INSTRUCTIONS  Get as much rest as you can. Nap when the baby sleeps.   Exercise regularly. Some women find yoga and walking to be beneficial.   Eat a balanced and nourishing diet.   Do little things that you enjoy. Have a cup of tea, take a bubble bath, read your favorite magazine, or listen to  your favorite music.  Avoid alcohol.   Ask for help with household chores, cooking, grocery shopping, or running errands as needed. Do not try to do everything.   Talk to people close to you about how you are feeling. Get support from your partner, family members, friends, or other new moms.  Try to stay positive in how you think. Think about the things you are grateful for.   Do not spend a lot of time alone.   Only take over-the-counter or prescription medicine as directed by your health care provider.  Keep all your postpartum appointments.   Let your health care provider know if you have any concerns.  SEEK MEDICAL CARE IF: You are having a reaction to or problems with your medicine. SEEK IMMEDIATE MEDICAL CARE IF:  You have suicidal feelings.   You think you may harm the baby or someone else. MAKE SURE YOU:  Understand these instructions.  Will watch your condition.  Will get help right away if you are not doing well or get worse.   This information is not intended to replace advice given to you by your health care provider. Make sure you discuss any questions you have with your health care provider.   Document Released: 09/02/2004 Document Revised: 12/04/2013 Document Reviewed: 09/10/2013 Elsevier Interactive Patient Education 2016 [employer]. Postpartum Care After Cesarean Delivery After you deliver your newborn (postpartum period), the usual stay in the hospital is 24-72 hours. If there were problems with your labor or delivery, or if you have other medical problems, you might be in the hospital longer.  While you are in the hospital, you will receive help and instructions on how to care for yourself and your newborn during the postpartum period.  While you are in the hospital:  It is normal for you to have pain or discomfort from the incision in your abdomen. Be sure to tell your nurses when you are having pain, where the pain is located, and what makes the  pain worse.  If you are breastfeeding, you may feel uncomfortable contractions of your uterus for a couple of weeks. This is normal. The contractions help your uterus get back to normal size.  It is normal to have some bleeding after delivery.  For the first 1-3 days after delivery,  the flow is red and the amount may be similar to a period.  It is common for the flow to start and stop.  In the first few days, you may pass some small clots. Let your nurses know if you begin to pass large clots or your flow increases.  Do not  flush blood clots down the toilet before having the nurse look at them.  During the next 3-10 days after delivery, your flow should become more watery and pink or brown-tinged in color.  Ten to fourteen days after delivery, your flow should be a small amount of yellowish-white discharge.  The amount of your flow will decrease over the first few weeks after delivery. Your flow may stop in 6-8 weeks. Most women have had their flow stop by 12 weeks after delivery.  You should change your sanitary pads frequently.  Wash your hands thoroughly with soap and water for at least 20 seconds after changing pads, using the toilet, or before holding or feeding your newborn.  Your intravenous (IV) tubing will be removed when you are drinking enough fluids.  The urine drainage tube (urinary catheter) that was inserted before delivery may be removed within 6-8 hours after delivery or when feeling returns to your legs. You should feel like you need to empty your bladder within the first 6-8 hours after the catheter has been removed.  In case you become weak, lightheaded, or faint, call your nurse before you get out of bed for the first time and before you take a shower for the first time.  Within the first few days after delivery, your breasts may begin to feel tender and full. This is called engorgement. Breast tenderness usually goes away within 48-72 hours after engorgement  occurs. You may also notice milk leaking from your breasts. If you are not breastfeeding, do not stimulate your breasts. Breast stimulation can make your breasts produce more milk.  Spending as much time as possible with your newborn is very important. During this time, you and your newborn can feel close and get to know each other. Having your newborn stay in your room (rooming in) will help to strengthen the bond with your newborn. It will give you time to get to know your newborn and become comfortable caring for your newborn.  Your hormones change after delivery. Sometimes the hormone changes can temporarily cause you to feel sad or tearful. These feelings should not last more than a few days. If these feelings last longer than that, you should talk to your caregiver.  If desired, talk to your caregiver about methods of family planning or contraception.  Talk to your caregiver about immunizations. Your caregiver may want you to have the following immunizations before leaving the hospital:  Tetanus, diphtheria, and pertussis (Tdap) or tetanus and diphtheria (Td) immunization. It is very important that you and your family (including grandparents) or others caring for your newborn are up-to-date with the Tdap or Td immunizations. The Tdap or Td immunization can help protect your newborn from getting ill.  Rubella immunization.  Varicella (chickenpox) immunization.  Influenza immunization. You should receive this annual immunization if you did not receive the immunization during your pregnancy.   This information is not intended to replace advice given to you by your health care provider. Make sure you discuss any questions you have with your health care provider.   Document Released: 08/23/2012 Document Reviewed: 08/23/2012 Elsevier Interactive Patient Education [employer].

## 2016-07-25 NOTE — Lactation Note (Signed)
This note was copied from a baby's chart. Lactation Consultation Note  Mother plans to get thyroid level checked within 4 weeks. Denies problems or concerns w/ breastfeeding at this time.  States he is doing well. Mom encouraged to feed baby 8-12 times/24 hours and with feeding cues.  Reviewed engorgement care and monitoring voids/stools. Suggest she call if she needs further assistance. Has manual pump.  Patient Name: Alison Kelly [id number] Date: 07/25/2016 Reason for consult: Follow-up assessment   Maternal Data    Feeding Feeding Type: Breast Fed Length of feed: 33 min  LATCH Score/Interventions Latch: Grasps breast easily, tongue down, lips flanged, rhythmical sucking.  Audible Swallowing: Spontaneous and intermittent  Type of Nipple: Everted at rest and after stimulation  Comfort (Breast/Nipple): Soft / non-tender     Hold (Positioning): No assistance needed to correctly position infant at breast. Intervention(s): Breastfeeding basics reviewed  LATCH Score: 10  Lactation Tools Discussed/Used     Consult Status Consult Status: Complete    Carlye Grippe 07/25/2016, 10:33 AM

## 2016-08-23 ENCOUNTER — Other Ambulatory Visit (INDEPENDENT_AMBULATORY_CARE_PROVIDER_SITE_OTHER): Payer: BLUE CROSS/BLUE SHIELD

## 2016-08-23 ENCOUNTER — Telehealth: Payer: Self-pay

## 2016-08-23 DIAGNOSIS — E063 Autoimmune thyroiditis: Secondary | ICD-10-CM | POA: Diagnosis not present

## 2016-08-23 DIAGNOSIS — E038 Other specified hypothyroidism: Secondary | ICD-10-CM

## 2016-08-23 LAB — T4, FREE: FREE T4: 0.92 ng/dL (ref 0.60–1.60)

## 2016-08-23 LAB — TSH: TSH: 1.31 u[IU]/mL (ref 0.35–4.50)

## 2016-08-23 NOTE — Telephone Encounter (Signed)
Called patient and gave lab results. Patient had no questions or concerns.  

## 2016-10-26 ENCOUNTER — Telehealth: Payer: Self-pay | Admitting: Internal Medicine

## 2016-10-26 NOTE — Telephone Encounter (Signed)
LMTRC

## 2016-10-26 NOTE — Telephone Encounter (Signed)
Pt.notified

## 2016-10-26 NOTE — Telephone Encounter (Signed)
Patient as questions about how to take her medication. Please advise

## 2016-10-26 NOTE — Telephone Encounter (Signed)
No

## 2016-10-26 NOTE — Telephone Encounter (Signed)
Pt was prescribed vitamin d 50000 units weekly and would like to know if this medication will interfere with her thyroid medication.

## 2016-11-29 ENCOUNTER — Ambulatory Visit (INDEPENDENT_AMBULATORY_CARE_PROVIDER_SITE_OTHER): Payer: BLUE CROSS/BLUE SHIELD | Admitting: Internal Medicine

## 2016-11-29 ENCOUNTER — Encounter: Payer: Self-pay | Admitting: Internal Medicine

## 2016-11-29 VITALS — BP 102/60 | HR 77 | Ht 65.0 in | Wt 176.4 lb

## 2016-11-29 DIAGNOSIS — E038 Other specified hypothyroidism: Secondary | ICD-10-CM

## 2016-11-29 DIAGNOSIS — E063 Autoimmune thyroiditis: Secondary | ICD-10-CM

## 2016-11-29 LAB — T4, FREE: FREE T4: 0.87 ng/dL (ref 0.60–1.60)

## 2016-11-29 LAB — TSH: TSH: 2.06 u[IU]/mL (ref 0.35–4.50)

## 2016-11-29 MED ORDER — LEVOTHYROXINE SODIUM 25 MCG PO TABS: 25.0000 ug | ORAL_TABLET | Freq: Every day | ORAL | 3 refills | Status: DC

## 2016-11-29 NOTE — Patient Instructions (Signed)
Please continue Levothyroxine 25 mcg daily.  Take the thyroid hormone every day, with water, at least 30 minutes before breakfast, separated by at least 4 hours from: - acid reflux medications - calcium - iron - multivitamins  Please stop at the lab.  Please come back for a follow-up appointment in one year. 
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
## 2016-12-01 ENCOUNTER — Telehealth: Payer: Self-pay

## 2016-12-01 NOTE — Telephone Encounter (Signed)
Called patient. Gave lab results. Patient verbalized understanding.  

## 2016-12-01 NOTE — Telephone Encounter (Signed)
-----   Message from [name], MD sent at 11/29/2016  6:03 PM EST ----- Alison Kelly, can you please call pt: Thyroid tests are normal.

## 2016-12-13 HISTORY — PX: COLONOSCOPY: SHX174

## 2017-05-03 ENCOUNTER — Encounter (HOSPITAL_COMMUNITY): Payer: Self-pay

## 2017-05-03 ENCOUNTER — Emergency Department (HOSPITAL_COMMUNITY)
Admission: EM | Admit: 2017-05-03 | Discharge: 2017-05-04 | Disposition: A | Discharge status: Home / Self Care | Payer: BLUE CROSS/BLUE SHIELD | Attending: Emergency Medicine | Admitting: Emergency Medicine

## 2017-05-03 ENCOUNTER — Emergency Department (HOSPITAL_COMMUNITY): Payer: BLUE CROSS/BLUE SHIELD

## 2017-05-03 DIAGNOSIS — E039 Hypothyroidism, unspecified: Secondary | ICD-10-CM | POA: Insufficient documentation

## 2017-05-03 DIAGNOSIS — R1030 Lower abdominal pain, unspecified: Secondary | ICD-10-CM | POA: Diagnosis present

## 2017-05-03 DIAGNOSIS — R109 Unspecified abdominal pain: Secondary | ICD-10-CM

## 2017-05-03 DIAGNOSIS — N10 Acute tubulo-interstitial nephritis: Secondary | ICD-10-CM | POA: Insufficient documentation

## 2017-05-03 LAB — COMPREHENSIVE METABOLIC PANEL
ALT: 30 U/L (ref 14–54)
AST: 33 U/L (ref 15–41)
Albumin: 3.8 g/dL (ref 3.5–5.0)
Alkaline Phosphatase: 78 U/L (ref 38–126)
Anion gap: 8 (ref 5–15)
BUN: 13 mg/dL (ref 6–20)
CALCIUM: 9 mg/dL (ref 8.9–10.3)
CO2: 24 mmol/L (ref 22–32)
CREATININE: 0.75 mg/dL (ref 0.44–1.00)
Chloride: 100 mmol/L — ABNORMAL LOW (ref 101–111)
GFR calc Af Amer: >60 mL/min (ref >60)
Glucose, Bld: 96 mg/dL (ref 65–99)
Potassium: 3.7 mmol/L (ref 3.5–5.1)
Sodium: 132 mmol/L — ABNORMAL LOW (ref 135–145)
Total Bilirubin: 0.7 mg/dL (ref 0.3–1.2)
Total Protein: 6.9 g/dL (ref 6.5–8.1)

## 2017-05-03 LAB — URINALYSIS, ROUTINE W REFLEX MICROSCOPIC
Bilirubin Urine: NEGATIVE
GLUCOSE, UA: NEGATIVE mg/dL
Hgb urine dipstick: NEGATIVE
KETONES UR: NEGATIVE mg/dL
LEUKOCYTES UA: NEGATIVE
NITRITE: NEGATIVE
PROTEIN: NEGATIVE mg/dL
Specific Gravity, Urine: 1.014 (ref 1.005–1.030)
pH: 8 (ref 5.0–8.0)

## 2017-05-03 LAB — CBC
HCT: 35.5 % — ABNORMAL LOW (ref 36.0–46.0)
Hemoglobin: 11.5 g/dL — ABNORMAL LOW (ref 12.0–15.0)
MCH: 27.4 pg (ref 26.0–34.0)
MCHC: 32.4 g/dL (ref 30.0–36.0)
MCV: 84.7 fL (ref 78.0–100.0)
PLATELETS: 238 10*3/uL (ref 150–400)
RBC: 4.19 MIL/uL (ref 3.87–5.11)
RDW: 12.8 % (ref 11.5–15.5)
WBC: 9.2 10*3/uL (ref 4.0–10.5)

## 2017-05-03 LAB — LIPASE, BLOOD: Lipase: 22 U/L (ref 11–51)

## 2017-05-03 LAB — I-STAT BETA HCG BLOOD, ED (MC, WL, AP ONLY): I-stat hCG, quantitative: <5 m[IU]/mL (ref <5)

## 2017-05-03 LAB — I-STAT CG4 LACTIC ACID, ED: LACTIC ACID, VENOUS: 0.35 mmol/L — AB (ref 0.5–1.9)

## 2017-05-03 MED ORDER — MORPHINE SULFATE (PF) 4 MG/ML IV SOLN
4.0000 mg | Freq: Once | INTRAVENOUS | Status: AC
  Administered 2017-05-03: 4 mg via INTRAMUSCULAR
  Filled 2017-05-03: qty 1

## 2017-05-03 MED ORDER — MORPHINE SULFATE (PF) 4 MG/ML IV SOLN: 4.0000 mg | Freq: Once | INTRAVENOUS | Status: DC

## 2017-05-03 MED ORDER — IOPAMIDOL (ISOVUE-300) INJECTION 61%
INTRAVENOUS | Status: AC
  Administered 2017-05-04: 100 mL
  Filled 2017-05-03: qty 100

## 2017-05-03 MED ORDER — SODIUM CHLORIDE 0.9 % IV BOLUS (SEPSIS): 1000.0000 mL | Freq: Once | INTRAVENOUS | Status: DC

## 2017-05-03 MED ORDER — MORPHINE SULFATE (PF) 4 MG/ML IV SOLN
4.0000 mg | Freq: Once | INTRAVENOUS | Status: AC
  Administered 2017-05-04: 4 mg via INTRAVENOUS
  Filled 2017-05-03: qty 1
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
## 2017-05-03 NOTE — ED Notes (Signed)
Pt to xray at this time.

## 2017-05-03 NOTE — ED Triage Notes (Signed)
Pt here for lower back pain as well as abdominal pain. She also reports last night she has been having hot flashes. Pt appears flushed and uncomfortable. Pt alert and oriented, had urinalysis at PCP office but left the appt early due to pain.

## 2017-05-04 ENCOUNTER — Emergency Department (HOSPITAL_COMMUNITY): Payer: BLUE CROSS/BLUE SHIELD

## 2017-05-04 MED ORDER — TRAMADOL HCL 50 MG PO TABS
50.0000 mg | ORAL_TABLET | Freq: Once | ORAL | Status: AC
  Administered 2017-05-04: 50 mg via ORAL
  Filled 2017-05-04: qty 1

## 2017-05-04 MED ORDER — DEXTROSE 5 % IV SOLN
1.0000 g | Freq: Once | INTRAVENOUS | Status: AC
  Administered 2017-05-04: 1 g via INTRAVENOUS
  Filled 2017-05-04: qty 10

## 2017-05-04 MED ORDER — TRAMADOL HCL 50 MG PO TABS: 50.0000 mg | ORAL_TABLET | Freq: Four times a day (QID) | ORAL | 0 refills | Status: DC | PRN

## 2017-05-04 MED ORDER — CEPHALEXIN 500 MG PO CAPS: 500.0000 mg | ORAL_CAPSULE | Freq: Four times a day (QID) | ORAL | 0 refills | Status: DC

## 2017-05-04 NOTE — ED Notes (Signed)
Pt waiting for CT scan,

## 2017-05-04 NOTE — ED Notes (Signed)
Patient transported to CT 

## 2017-05-04 NOTE — ED Notes (Signed)
Pt stable, understands discharge instructions, and reasons for return.   

## 2017-05-05 LAB — URINE CULTURE

## 2017-07-27 DIAGNOSIS — R109 Unspecified abdominal pain: Secondary | ICD-10-CM | POA: Diagnosis not present

## 2017-08-04 ENCOUNTER — Emergency Department (HOSPITAL_BASED_OUTPATIENT_CLINIC_OR_DEPARTMENT_OTHER)
Admission: EM | Admit: 2017-08-04 | Discharge: 2017-08-04 | Disposition: A | Discharge status: Home / Self Care | Payer: 59 | Attending: Emergency Medicine | Admitting: Emergency Medicine

## 2017-08-04 ENCOUNTER — Encounter (HOSPITAL_BASED_OUTPATIENT_CLINIC_OR_DEPARTMENT_OTHER): Payer: Self-pay | Admitting: *Deleted

## 2017-08-04 DIAGNOSIS — N390 Urinary tract infection, site not specified: Secondary | ICD-10-CM | POA: Insufficient documentation

## 2017-08-04 DIAGNOSIS — E039 Hypothyroidism, unspecified: Secondary | ICD-10-CM | POA: Diagnosis not present

## 2017-08-04 DIAGNOSIS — Z79899 Other long term (current) drug therapy: Secondary | ICD-10-CM | POA: Diagnosis not present

## 2017-08-04 DIAGNOSIS — M545 Low back pain, unspecified: Secondary | ICD-10-CM

## 2017-08-04 LAB — BASIC METABOLIC PANEL
Anion gap: 8 (ref 5–15)
BUN: 16 mg/dL (ref 6–20)
CHLORIDE: 104 mmol/L (ref 101–111)
CO2: 25 mmol/L (ref 22–32)
Calcium: 8.7 mg/dL — ABNORMAL LOW (ref 8.9–10.3)
Creatinine, Ser: 0.85 mg/dL (ref 0.44–1.00)
GFR calc non Af Amer: >60 mL/min (ref >60)
Glucose, Bld: 92 mg/dL (ref 65–99)
POTASSIUM: 3.8 mmol/L (ref 3.5–5.1)
SODIUM: 137 mmol/L (ref 135–145)

## 2017-08-04 LAB — URINALYSIS, ROUTINE W REFLEX MICROSCOPIC
Bilirubin Urine: NEGATIVE
GLUCOSE, UA: NEGATIVE mg/dL
HGB URINE DIPSTICK: NEGATIVE
KETONES UR: NEGATIVE mg/dL
Nitrite: NEGATIVE
PH: 7 (ref 5.0–8.0)
PROTEIN: NEGATIVE mg/dL
Specific Gravity, Urine: 1.004 — ABNORMAL LOW (ref 1.005–1.030)

## 2017-08-04 LAB — CBC WITH DIFFERENTIAL/PLATELET
BASOS PCT: 0 %
Basophils Absolute: 0 10*3/uL (ref 0.0–0.1)
EOS ABS: 0.1 10*3/uL (ref 0.0–0.7)
Eosinophils Relative: 2 %
HEMATOCRIT: 36.7 % (ref 36.0–46.0)
HEMOGLOBIN: 12 g/dL (ref 12.0–15.0)
LYMPHS ABS: 2.5 10*3/uL (ref 0.7–4.0)
Lymphocytes Relative: 34 %
MCH: 27.3 pg (ref 26.0–34.0)
MCHC: 32.7 g/dL (ref 30.0–36.0)
MCV: 83.6 fL (ref 78.0–100.0)
MONOS PCT: 6 %
Monocytes Absolute: 0.4 10*3/uL (ref 0.1–1.0)
NEUTROS ABS: 4.2 10*3/uL (ref 1.7–7.7)
NEUTROS PCT: 58 %
Platelets: 248 10*3/uL (ref 150–400)
RBC: 4.39 MIL/uL (ref 3.87–5.11)
RDW: 14.2 % (ref 11.5–15.5)
WBC: 7.3 10*3/uL (ref 4.0–10.5)

## 2017-08-04 LAB — URINALYSIS, MICROSCOPIC (REFLEX)

## 2017-08-04 MED ORDER — KETOROLAC TROMETHAMINE 30 MG/ML IJ SOLN
30.0000 mg | Freq: Once | INTRAMUSCULAR | Status: AC
  Administered 2017-08-04: 30 mg via INTRAMUSCULAR
  Filled 2017-08-04: qty 1

## 2017-08-04 MED ORDER — CEPHALEXIN 500 MG PO CAPS: 500.0000 mg | ORAL_CAPSULE | Freq: Four times a day (QID) | ORAL | 0 refills | Status: DC
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
## 2017-08-04 NOTE — Discharge Instructions (Signed)
Complete the cipro. Start the keflex.  Follow-up with urology as scheduled on Monday.

## 2017-08-04 NOTE — ED Triage Notes (Signed)
States she is having back pain. Currently being treated with Cipro for a UTI. States her Urine and blood work did not show a UTI but it showed up on a CT scan. States "I know it does not make sense and they are referring me to a urologist".

## 2017-08-06 LAB — URINE CULTURE

## 2017-08-07 ENCOUNTER — Telehealth: Payer: Self-pay

## 2017-08-07 NOTE — Telephone Encounter (Signed)
Post ED Visit - Positive Culture Follow-up  Culture report reviewed by antimicrobial stewardship pharmacist:  []  Elenor Quinones, Pharm.D. []  Heide Guile, Pharm.D., BCPS AQ-ID [x]  Parks Neptune, Pharm.D., BCPS []  Alycia Rossetti, Pharm.D., BCPS []  [place], Pharm.D., BCPS, AAHIVP []  Legrand Como, Pharm.D., BCPS, AAHIVP []  Salome Arnt, PharmD, BCPS []  Dimitri Ped, PharmD, BCPS []  Vincenza Hews, PharmD, BCPS  Positive urine culture Treated with Cephalexin, organism sensitive to the same and no further patient follow-up is required at this time.  Genia Del 08/07/2017, 10:01 AM

## 2017-08-08 DIAGNOSIS — R8271 Bacteriuria: Secondary | ICD-10-CM | POA: Diagnosis not present

## 2017-08-08 DIAGNOSIS — N110 Nonobstructive reflux-associated chronic pyelonephritis: Secondary | ICD-10-CM | POA: Diagnosis not present

## 2017-08-25 DIAGNOSIS — N110 Nonobstructive reflux-associated chronic pyelonephritis: Secondary | ICD-10-CM | POA: Diagnosis not present

## 2017-08-25 DIAGNOSIS — N111 Chronic obstructive pyelonephritis: Secondary | ICD-10-CM | POA: Diagnosis not present

## 2017-08-26 DIAGNOSIS — F429 Obsessive-compulsive disorder, unspecified: Secondary | ICD-10-CM | POA: Diagnosis not present

## 2017-08-26 DIAGNOSIS — Q383 Other congenital malformations of tongue: Secondary | ICD-10-CM | POA: Diagnosis not present

## 2017-09-02 DIAGNOSIS — N926 Irregular menstruation, unspecified: Secondary | ICD-10-CM | POA: Diagnosis not present

## 2017-09-02 DIAGNOSIS — N920 Excessive and frequent menstruation with regular cycle: Secondary | ICD-10-CM | POA: Diagnosis not present

## 2017-09-02 DIAGNOSIS — F429 Obsessive-compulsive disorder, unspecified: Secondary | ICD-10-CM | POA: Diagnosis not present

## 2017-10-03 MED FILL — FLUVOXAMINE MALEATE 50 MG T: 50 | 30 days supply | Qty: 90 | Fill #0

## 2017-10-03 MED FILL — LEVOTHYROXINE 25 MCG TABLET: 25 | 90 days supply | Qty: 90 | Fill #0

## 2017-11-10 MED FILL — FLUVOXAMINE MALEATE 50 MG T: 50 | 3 days supply | Qty: 10 | Fill #1

## 2017-11-17 DIAGNOSIS — R238 Other skin changes: Secondary | ICD-10-CM | POA: Diagnosis not present

## 2017-11-17 DIAGNOSIS — Z01419 Encounter for gynecological examination (general) (routine) without abnormal findings: Secondary | ICD-10-CM | POA: Diagnosis not present

## 2017-11-17 DIAGNOSIS — D649 Anemia, unspecified: Secondary | ICD-10-CM | POA: Diagnosis not present

## 2017-11-17 DIAGNOSIS — Z6839 Body mass index (BMI) 39.0-39.9, adult: Secondary | ICD-10-CM | POA: Diagnosis not present

## 2017-11-17 DIAGNOSIS — F429 Obsessive-compulsive disorder, unspecified: Secondary | ICD-10-CM | POA: Diagnosis not present

## 2017-11-17 DIAGNOSIS — L680 Hirsutism: Secondary | ICD-10-CM | POA: Diagnosis not present

## 2017-11-17 DIAGNOSIS — F419 Anxiety disorder, unspecified: Secondary | ICD-10-CM | POA: Diagnosis not present

## 2017-11-17 DIAGNOSIS — N920 Excessive and frequent menstruation with regular cycle: Secondary | ICD-10-CM | POA: Diagnosis not present

## 2017-11-17 DIAGNOSIS — Z124 Encounter for screening for malignant neoplasm of cervix: Secondary | ICD-10-CM | POA: Diagnosis not present

## 2017-11-17 DIAGNOSIS — N938 Other specified abnormal uterine and vaginal bleeding: Secondary | ICD-10-CM | POA: Diagnosis not present

## 2017-11-18 MED FILL — FLUVOXAMINE MALEATE 50 MG T: 50 | 33 days supply | Qty: 100 | Fill #0

## 2017-11-29 ENCOUNTER — Ambulatory Visit: Payer: BLUE CROSS/BLUE SHIELD | Admitting: Internal Medicine
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
## 2017-12-13 HISTORY — PX: ENDOMETRIAL ABLATION: SHX621

## 2017-12-28 MED FILL — FLUVOXAMINE MALEATE 50 MG T: 50 | 33 days supply | Qty: 100 | Fill #1

## 2018-01-06 DIAGNOSIS — F429 Obsessive-compulsive disorder, unspecified: Secondary | ICD-10-CM | POA: Diagnosis not present

## 2018-01-06 DIAGNOSIS — F331 Major depressive disorder, recurrent, moderate: Secondary | ICD-10-CM | POA: Diagnosis not present

## 2018-01-06 DIAGNOSIS — F4310 Post-traumatic stress disorder, unspecified: Secondary | ICD-10-CM | POA: Diagnosis not present

## 2018-01-27 DIAGNOSIS — F429 Obsessive-compulsive disorder, unspecified: Secondary | ICD-10-CM | POA: Diagnosis not present

## 2018-01-27 DIAGNOSIS — F331 Major depressive disorder, recurrent, moderate: Secondary | ICD-10-CM | POA: Diagnosis not present

## 2018-01-27 DIAGNOSIS — F4310 Post-traumatic stress disorder, unspecified: Secondary | ICD-10-CM | POA: Diagnosis not present

## 2018-02-10 ENCOUNTER — Other Ambulatory Visit: Payer: Self-pay | Admitting: Internal Medicine

## 2018-02-10 MED FILL — FLUVOXAMINE MALEATE 50 MG T: 50 | 33 days supply | Qty: 100 | Fill #2

## 2018-02-13 NOTE — Telephone Encounter (Signed)
Is this okay to refill? 

## 2018-02-13 NOTE — Telephone Encounter (Signed)
Last refill - 3 mo. Needs appt within next 3 mo

## 2018-03-17 DIAGNOSIS — F429 Obsessive-compulsive disorder, unspecified: Secondary | ICD-10-CM | POA: Diagnosis not present

## 2018-03-17 DIAGNOSIS — F4310 Post-traumatic stress disorder, unspecified: Secondary | ICD-10-CM | POA: Diagnosis not present

## 2018-03-17 DIAGNOSIS — F331 Major depressive disorder, recurrent, moderate: Secondary | ICD-10-CM | POA: Diagnosis not present

## 2018-03-30 MED FILL — busPIRone HCL 15 MG TABS: 15 | 30 days supply | Qty: 60 | Fill #0

## 2018-03-31 MED FILL — FLUVOXAMINE MALEATE 50 MG T: 50 | 30 days supply | Qty: 90 | Fill #0

## 2018-04-06 ENCOUNTER — Encounter: Payer: Self-pay | Admitting: Internal Medicine

## 2018-04-06 ENCOUNTER — Ambulatory Visit (INDEPENDENT_AMBULATORY_CARE_PROVIDER_SITE_OTHER): Payer: 59 | Admitting: Internal Medicine

## 2018-04-06 VITALS — BP 124/84 | HR 97 | Resp 18 | Ht 65.95 in | Wt 193.4 lb

## 2018-04-06 DIAGNOSIS — E063 Autoimmune thyroiditis: Secondary | ICD-10-CM

## 2018-04-06 DIAGNOSIS — E038 Other specified hypothyroidism: Secondary | ICD-10-CM | POA: Diagnosis not present

## 2018-04-06 DIAGNOSIS — E559 Vitamin D deficiency, unspecified: Secondary | ICD-10-CM | POA: Diagnosis not present

## 2018-04-06 DIAGNOSIS — Z130 Encounter for screening for diseases of the blood and blood-forming organs and certain disorders involving the immune mechanism: Secondary | ICD-10-CM | POA: Diagnosis not present

## 2018-04-06 DIAGNOSIS — N938 Other specified abnormal uterine and vaginal bleeding: Secondary | ICD-10-CM | POA: Diagnosis not present

## 2018-04-06 NOTE — Patient Instructions (Signed)
Please continue Levothyroxine 25 mcg daily.  Take the thyroid hormone every day, with water, at least 30 minutes before breakfast, separated by at least 4 hours from: - acid reflux medications - calcium - iron - multivitamins  Please stop at the lab.  Please come back for a follow-up appointment in one year.
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
## 2018-04-26 DIAGNOSIS — N938 Other specified abnormal uterine and vaginal bleeding: Secondary | ICD-10-CM | POA: Diagnosis not present

## 2018-04-26 DIAGNOSIS — N946 Dysmenorrhea, unspecified: Secondary | ICD-10-CM | POA: Diagnosis not present

## 2018-05-05 MED FILL — FLUVOXAMINE MALEATE 50 MG T: 50 | 30 days supply | Qty: 90 | Fill #1

## 2018-05-12 ENCOUNTER — Other Ambulatory Visit: Payer: 59

## 2018-05-12 DIAGNOSIS — Z1331 Encounter for screening for depression: Secondary | ICD-10-CM | POA: Diagnosis not present

## 2018-05-12 DIAGNOSIS — Z0000 Encounter for general adult medical examination without abnormal findings: Secondary | ICD-10-CM | POA: Diagnosis not present

## 2018-05-12 DIAGNOSIS — Z8041 Family history of malignant neoplasm of ovary: Secondary | ICD-10-CM | POA: Diagnosis not present

## 2018-05-12 DIAGNOSIS — Z803 Family history of malignant neoplasm of breast: Secondary | ICD-10-CM | POA: Diagnosis not present

## 2018-05-24 ENCOUNTER — Encounter (INDEPENDENT_AMBULATORY_CARE_PROVIDER_SITE_OTHER): Payer: Self-pay

## 2018-05-26 DIAGNOSIS — Z8041 Family history of malignant neoplasm of ovary: Secondary | ICD-10-CM | POA: Diagnosis not present

## 2018-05-26 DIAGNOSIS — Z800 Family history of malignant neoplasm of digestive organs: Secondary | ICD-10-CM | POA: Diagnosis not present

## 2018-05-26 DIAGNOSIS — Z803 Family history of malignant neoplasm of breast: Secondary | ICD-10-CM | POA: Diagnosis not present

## 2018-05-26 DIAGNOSIS — Z808 Family history of malignant neoplasm of other organs or systems: Secondary | ICD-10-CM | POA: Diagnosis not present

## 2018-06-01 DIAGNOSIS — H1031 Unspecified acute conjunctivitis, right eye: Secondary | ICD-10-CM | POA: Diagnosis not present

## 2018-06-09 MED FILL — FLUVOXAMINE MALEATE 50 MG T: 50 | 30 days supply | Qty: 90 | Fill #2

## 2018-07-13 DIAGNOSIS — N920 Excessive and frequent menstruation with regular cycle: Secondary | ICD-10-CM | POA: Diagnosis not present

## 2018-07-13 DIAGNOSIS — Z719 Counseling, unspecified: Secondary | ICD-10-CM | POA: Diagnosis not present

## 2018-07-13 DIAGNOSIS — N938 Other specified abnormal uterine and vaginal bleeding: Secondary | ICD-10-CM | POA: Diagnosis not present

## 2018-08-10 DIAGNOSIS — N939 Abnormal uterine and vaginal bleeding, unspecified: Secondary | ICD-10-CM | POA: Diagnosis not present

## 2018-08-10 DIAGNOSIS — R102 Pelvic and perineal pain: Secondary | ICD-10-CM | POA: Diagnosis not present

## 2018-08-11 ENCOUNTER — Encounter: Payer: Self-pay | Admitting: Internal Medicine

## 2018-08-11 NOTE — Progress Notes (Signed)
Received labs from 08/11/2018: -Beta-hCG less than 2 -CBC with low white blood cell count of 3.1 (3.8-10.8), low hemoglobin at 10.7 (11.7-15.5) low MCV at 75.5 (80-100, normal platelets at 256 (140-400) -Normal CMP except for a slightly elevated ALT of 33 (6-29) -Normal TSH at 1.92

## 2018-09-05 DIAGNOSIS — L648 Other androgenic alopecia: Secondary | ICD-10-CM | POA: Diagnosis not present

## 2018-09-15 DIAGNOSIS — N938 Other specified abnormal uterine and vaginal bleeding: Secondary | ICD-10-CM | POA: Diagnosis not present

## 2018-09-15 DIAGNOSIS — N920 Excessive and frequent menstruation with regular cycle: Secondary | ICD-10-CM | POA: Diagnosis not present

## 2018-09-15 DIAGNOSIS — Z3202 Encounter for pregnancy test, result negative: Secondary | ICD-10-CM | POA: Diagnosis not present

## 2018-10-05 DIAGNOSIS — R112 Nausea with vomiting, unspecified: Secondary | ICD-10-CM | POA: Diagnosis not present

## 2019-04-06 ENCOUNTER — Other Ambulatory Visit: Payer: Self-pay

## 2019-04-06 ENCOUNTER — Ambulatory Visit (INDEPENDENT_AMBULATORY_CARE_PROVIDER_SITE_OTHER): Payer: 59 | Admitting: Internal Medicine

## 2019-04-06 ENCOUNTER — Encounter: Payer: Self-pay | Admitting: Internal Medicine

## 2019-04-06 DIAGNOSIS — N921 Excessive and frequent menstruation with irregular cycle: Secondary | ICD-10-CM | POA: Diagnosis not present

## 2019-04-06 DIAGNOSIS — E663 Overweight: Secondary | ICD-10-CM

## 2019-04-06 DIAGNOSIS — E038 Other specified hypothyroidism: Secondary | ICD-10-CM | POA: Diagnosis not present

## 2019-04-06 DIAGNOSIS — E063 Autoimmune thyroiditis: Secondary | ICD-10-CM | POA: Diagnosis not present

## 2019-04-06 NOTE — Patient Instructions (Signed)
Please continue off levothyroxine.  If we need to restart levothyroxine, please remember to take the thyroid hormone every day, with water, at least 30 minutes before breakfast, separated by at least 4 hours from: - acid reflux medications - calcium - iron - multivitamins  Please come back for labs as soon as safe.  Please come back for a follow-up appointment in one year.
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
## 2019-06-12 DIAGNOSIS — F419 Anxiety disorder, unspecified: Secondary | ICD-10-CM | POA: Diagnosis not present

## 2019-06-13 DIAGNOSIS — F419 Anxiety disorder, unspecified: Secondary | ICD-10-CM | POA: Diagnosis not present

## 2019-06-25 DIAGNOSIS — F4323 Adjustment disorder with mixed anxiety and depressed mood: Secondary | ICD-10-CM | POA: Diagnosis not present

## 2019-06-25 DIAGNOSIS — F429 Obsessive-compulsive disorder, unspecified: Secondary | ICD-10-CM | POA: Diagnosis not present

## 2019-06-29 ENCOUNTER — Other Ambulatory Visit: Payer: Self-pay | Admitting: Internal Medicine

## 2019-06-29 ENCOUNTER — Encounter: Payer: Self-pay | Admitting: Internal Medicine

## 2019-06-29 DIAGNOSIS — E663 Overweight: Secondary | ICD-10-CM

## 2019-07-02 ENCOUNTER — Other Ambulatory Visit (INDEPENDENT_AMBULATORY_CARE_PROVIDER_SITE_OTHER): Payer: 59

## 2019-07-02 ENCOUNTER — Other Ambulatory Visit: Payer: Self-pay

## 2019-07-02 DIAGNOSIS — E063 Autoimmune thyroiditis: Secondary | ICD-10-CM

## 2019-07-02 DIAGNOSIS — E038 Other specified hypothyroidism: Secondary | ICD-10-CM

## 2019-07-02 DIAGNOSIS — E663 Overweight: Secondary | ICD-10-CM

## 2019-07-02 LAB — T4, FREE: Free T4: 0.84 ng/dL (ref 0.60–1.60)

## 2019-07-02 LAB — HEMOGLOBIN A1C: Hgb A1c MFr Bld: 5.3 % (ref 4.6–6.5)

## 2019-07-02 LAB — TSH: TSH: 2.39 u[IU]/mL (ref 0.35–4.50)

## 2019-07-02 LAB — T3, FREE: T3, Free: 3.2 pg/mL (ref 2.3–4.2)

## 2019-07-03 DIAGNOSIS — F4323 Adjustment disorder with mixed anxiety and depressed mood: Secondary | ICD-10-CM | POA: Diagnosis not present

## 2019-07-03 DIAGNOSIS — F429 Obsessive-compulsive disorder, unspecified: Secondary | ICD-10-CM | POA: Diagnosis not present

## 2019-07-03 LAB — THYROID PEROXIDASE ANTIBODY: Thyroperoxidase Ab SerPl-aCnc: 20 IU/mL — ABNORMAL HIGH (ref <9)

## 2019-07-04 ENCOUNTER — Encounter: Payer: Self-pay | Admitting: Internal Medicine

## 2019-07-04 DIAGNOSIS — F419 Anxiety disorder, unspecified: Secondary | ICD-10-CM | POA: Diagnosis not present

## 2019-07-10 DIAGNOSIS — F429 Obsessive-compulsive disorder, unspecified: Secondary | ICD-10-CM | POA: Diagnosis not present

## 2019-07-10 DIAGNOSIS — F4323 Adjustment disorder with mixed anxiety and depressed mood: Secondary | ICD-10-CM | POA: Diagnosis not present

## 2019-07-11 DIAGNOSIS — F419 Anxiety disorder, unspecified: Secondary | ICD-10-CM | POA: Diagnosis not present

## 2019-07-17 DIAGNOSIS — F4323 Adjustment disorder with mixed anxiety and depressed mood: Secondary | ICD-10-CM | POA: Diagnosis not present

## 2019-07-17 DIAGNOSIS — F429 Obsessive-compulsive disorder, unspecified: Secondary | ICD-10-CM | POA: Diagnosis not present

## 2019-07-24 DIAGNOSIS — F4323 Adjustment disorder with mixed anxiety and depressed mood: Secondary | ICD-10-CM | POA: Diagnosis not present

## 2019-07-24 DIAGNOSIS — N939 Abnormal uterine and vaginal bleeding, unspecified: Secondary | ICD-10-CM | POA: Diagnosis not present

## 2019-07-24 DIAGNOSIS — F429 Obsessive-compulsive disorder, unspecified: Secondary | ICD-10-CM | POA: Diagnosis not present

## 2019-07-31 DIAGNOSIS — F429 Obsessive-compulsive disorder, unspecified: Secondary | ICD-10-CM | POA: Diagnosis not present

## 2019-07-31 DIAGNOSIS — F4323 Adjustment disorder with mixed anxiety and depressed mood: Secondary | ICD-10-CM | POA: Diagnosis not present

## 2019-08-07 DIAGNOSIS — F429 Obsessive-compulsive disorder, unspecified: Secondary | ICD-10-CM | POA: Diagnosis not present

## 2019-08-07 DIAGNOSIS — F4323 Adjustment disorder with mixed anxiety and depressed mood: Secondary | ICD-10-CM | POA: Diagnosis not present

## 2019-08-08 ENCOUNTER — Other Ambulatory Visit: Payer: Self-pay | Admitting: Obstetrics and Gynecology

## 2019-08-08 DIAGNOSIS — F419 Anxiety disorder, unspecified: Secondary | ICD-10-CM | POA: Diagnosis not present

## 2019-08-17 DIAGNOSIS — R197 Diarrhea, unspecified: Secondary | ICD-10-CM | POA: Diagnosis not present

## 2019-08-17 DIAGNOSIS — K9041 Non-celiac gluten sensitivity: Secondary | ICD-10-CM | POA: Diagnosis not present

## 2019-08-17 DIAGNOSIS — K5900 Constipation, unspecified: Secondary | ICD-10-CM | POA: Diagnosis not present

## 2019-09-07 ENCOUNTER — Ambulatory Visit: Payer: 59 | Attending: Obstetrics and Gynecology | Admitting: Physical Therapy

## 2019-09-07 ENCOUNTER — Other Ambulatory Visit: Payer: Self-pay

## 2019-09-07 ENCOUNTER — Encounter: Payer: Self-pay | Admitting: Physical Therapy

## 2019-09-07 DIAGNOSIS — R252 Cramp and spasm: Secondary | ICD-10-CM | POA: Insufficient documentation

## 2019-09-07 DIAGNOSIS — Z23 Encounter for immunization: Secondary | ICD-10-CM | POA: Diagnosis not present

## 2019-09-07 DIAGNOSIS — M6281 Muscle weakness (generalized): Secondary | ICD-10-CM | POA: Diagnosis not present
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
## 2019-09-14 ENCOUNTER — Ambulatory Visit: Payer: 59 | Admitting: Physical Therapy

## 2019-09-18 DIAGNOSIS — Z01818 Encounter for other preprocedural examination: Secondary | ICD-10-CM | POA: Diagnosis not present

## 2019-09-18 DIAGNOSIS — N938 Other specified abnormal uterine and vaginal bleeding: Secondary | ICD-10-CM

## 2019-09-18 DIAGNOSIS — E669 Obesity, unspecified: Secondary | ICD-10-CM | POA: Diagnosis not present

## 2019-09-18 DIAGNOSIS — R140 Abdominal distension (gaseous): Secondary | ICD-10-CM | POA: Diagnosis not present

## 2019-09-18 DIAGNOSIS — K5904 Chronic idiopathic constipation: Secondary | ICD-10-CM | POA: Diagnosis not present

## 2019-09-18 DIAGNOSIS — N939 Abnormal uterine and vaginal bleeding, unspecified: Secondary | ICD-10-CM | POA: Diagnosis not present

## 2019-09-18 NOTE — H&P (Signed)
Alison Kelly is a 33 y.o.  female, P: 2-0-0-2 presenting for hysterectomy because dysfunctional uterine bleeding.  Patient states that all of her life she has had heavy bleeding. and in 2019 the patient underwent an endometrial ablation.  For 3 months post procedure the patient had total relief however, she then resumed her heavy bleeding characterized by 7 days of flow, stop for 8 days then the cycle would repeat.  The longest she has gone without bleeding has been 11 days.  During her bleeding episodes she will change a pad every 4 hours and experience cramping that she rates as 5/10 on a 10 point pain scale.  She finds relief with Ibuprofen 800 mg plus Tylenol 650 mg.  She denies any bowel or bladder changes, dyspareunia, vaginitis symptoms or fever.   A pelvic ultrasound at pre-op visit revealed: anteverted uterus: 5.73 x 4.29 x 5.17 cm; endometrium: 3.49 mm; right ovary-2.30 cm and left ovary-2.95 cm (fundus to external os = 9 cm).  In August 2020 hemoglobin was 13.  A review of both medical and surgical managment options were given to the patient however she desires definitive therapy in the form of hysterectomy.    Past Medical History OB History: G: 2;  P: 2-0-0-2;  C-section: 2014 and 2017  GYN History: menarche: 33 YO    LMP : see HPI   Contraception: none;  Denies history of abnormal PAP smear.   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 Pineapple Swelling    Lips/tongue swelling, doesn't not bother breathing  . Codeine Hives  . Darvocet [Propoxyphene N-Acetaminophen] Hives  . Hydrocodone Hives  . Methotrexate Derivatives Hives and Rash  . Prednisone Hives and Rash  . Sulfur Rash   Denies sensitivity to peanuts, shellfish, soy or  latex.  Patient is sensitive to adhesives.  ROS: Admits to constipation/diarrhea-seeing GI specialist, joint swelling with gluten but denies headache, vision changes, nasal congestion, dysphagia, tinnitus, dizziness, hoarseness, cough,  chest pain, shortness of breath, nausea, vomiting, diarrhea,constipation,  urinary frequency, urgency  dysuria, hematuria, vaginitis symptoms, pelvic pain, easy bruising,  myalgias, arthralgias, skin rashes, unexplained weight loss and except as is mentioned in the history of present illness, patient's review of systems is otherwise negative.   Medications: Levothyroxine 25 mcg daily Fluvoxamine 50 mg daily   Physical Exam  Bp: 108/60  P: 84 bpm  R: 16  Temperature: 97.5 degrees F orally  Weight: 180 lbs. Height: 5'4"  BMI: 30.9  Neck: supple without masses or thyromegaly Lungs: clear to auscultation Heart: regular rate and rhythm Abdomen: soft, non-tender and no organomegaly Pelvic:EGBUS- wnl; vagina-normal rugae; uterus-normal size, cervix without lesions or motion tenderness; adnexae-no tenderness or masses Extremities:  no clubbing, cyanosis or edema   Assesment: Dysfunctional Uterine Bleeding   Disposition:  A discussion was held with patient regarding the indication for her procedure(s) along with the risks, which include but are not limited to: reaction to anesthesia, damage to adjacent organs, infection, excessive bleeding and the possible need for an open abdominal  incision.  The patient verbalized understanding of these risks and has consented to proceed with a Total Laparoscopic  Hysterectomy with Bilateral Salpingectomy at [facility] on October 02, 2019.   CSN# [id number]   Alison Kelly J. Florene Glen, PA-C  for Dr. Harvie Bridge. Mancel Bale

## 2019-09-20 ENCOUNTER — Encounter: Payer: Self-pay | Admitting: Physical Therapy

## 2019-09-20 ENCOUNTER — Ambulatory Visit: Payer: 59 | Attending: Obstetrics and Gynecology | Admitting: Physical Therapy

## 2019-09-20 ENCOUNTER — Other Ambulatory Visit: Payer: Self-pay

## 2019-09-20 DIAGNOSIS — R252 Cramp and spasm: Secondary | ICD-10-CM | POA: Insufficient documentation

## 2019-09-20 DIAGNOSIS — M6281 Muscle weakness (generalized): Secondary | ICD-10-CM | POA: Insufficient documentation
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
## 2019-09-20 NOTE — Patient Instructions (Addendum)
About Abdominal Massage  Abdominal massage, also called external colon massage, is a self-treatment circular massage technique that can reduce and eliminate gas and ease constipation. The colon naturally contracts in waves in a clockwise direction starting from inside the right hip, moving up toward the ribs, across the belly, and down inside the left hip.  When you perform circular abdominal massage, you help stimulate your colon's normal wave pattern of movement called peristalsis.  It is most beneficial when done after eating.  Positioning You can practice abdominal massage with oil while lying down, or in the shower with soap.  Some people find that it is just as effective to do the massage through clothing while sitting or standing.  How to Massage Start by placing your finger tips or knuckles on your right side, just inside your hip bone.  . Make small circular movements while you move upward toward your rib cage.   . Once you reach the bottom right side of your rib cage, take your circular movements across to the left side of the bottom of your rib cage.  . Next, move downward until you reach the inside of your left hip bone.  This is the path your feces travel in your colon. . Continue to perform your abdominal massage in this pattern for 10 minutes each day.     You can apply as much pressure as is comfortable in your massage.  Start gently and build pressure as you continue to practice.  Notice any areas of pain as you massage; areas of slight pain may be relieved as you massage, but if you have areas of significant or intense pain, consult with your healthcare provider.  Other Considerations . General physical activity including bending and stretching can have a beneficial massage-like effect on the colon.  Deep breathing can also stimulate the colon because breathing deeply activates the same nervous system that supplies the colon.   . Abdominal massage should always be used in  combination with a bowel-conscious diet that is high in the proper type of fiber for you, fluids (primarily water), and a regular exercise program. Bear Down    Breath making your stomach big and bulging the pelvic floor. Repeat _5__ times. Do _3__ times a day.  Copyright  VHI. All rights reserved.   Contact [facility].  [facility] [address] [place], [place] [address] Phone # [phone] Fax [phone]

## 2019-09-21 ENCOUNTER — Encounter: Payer: 59 | Admitting: Physical Therapy

## 2019-09-28 ENCOUNTER — Other Ambulatory Visit (HOSPITAL_COMMUNITY): Payer: 59

## 2019-09-28 ENCOUNTER — Ambulatory Visit: Payer: 59 | Admitting: Physical Therapy

## 2019-10-02 ENCOUNTER — Ambulatory Visit: Payer: 59 | Admitting: Physical Therapy

## 2019-10-02 ENCOUNTER — Encounter: Payer: Self-pay | Admitting: Physical Therapy

## 2019-10-02 ENCOUNTER — Ambulatory Visit: Admit: 2019-10-02 | Payer: 59 | Admitting: Obstetrics and Gynecology

## 2019-10-02 ENCOUNTER — Other Ambulatory Visit: Payer: Self-pay

## 2019-10-02 DIAGNOSIS — M6281 Muscle weakness (generalized): Secondary | ICD-10-CM

## 2019-10-02 DIAGNOSIS — R252 Cramp and spasm: Secondary | ICD-10-CM | POA: Diagnosis not present

## 2019-10-02 SURGERY — HYSTERECTOMY, TOTAL, LAPAROSCOPIC, WITH SALPINGECTOMY
Anesthesia: General | Laterality: Bilateral
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
## 2019-10-09 ENCOUNTER — Ambulatory Visit: Payer: 59 | Admitting: Physical Therapy

## 2019-10-09 ENCOUNTER — Encounter: Payer: Self-pay | Admitting: Physical Therapy

## 2019-10-09 ENCOUNTER — Other Ambulatory Visit: Payer: Self-pay

## 2019-10-09 DIAGNOSIS — M6281 Muscle weakness (generalized): Secondary | ICD-10-CM | POA: Diagnosis not present

## 2019-10-09 DIAGNOSIS — R252 Cramp and spasm: Secondary | ICD-10-CM

## 2019-10-09 NOTE — Patient Instructions (Signed)
Access Code: [email or web address]  URL: [email or web address]  Date: 10/09/2019  Prepared by: Earlie Counts   Exercises Sidelying IT Band Foam Roll Mobilization - 10 reps - 1 sets - 1x daily - 7x weekly Piriformis Mobilization on Foam Roll - 10 reps - 1 sets - 1x daily - 7x weekly Quadriceps Mobilization with Foam Roll - 10 reps - 1 sets - 1x daily - 7x weekly Thoracic Mobilization on Foam Roll - Hands Clasped - 10 reps - 1 sets - 1x daily - 7x weekly Thoracic Stretch on Foam Roll - Hands Clasped - 10 reps - 1 sets - 1x daily - 7x weekly Supine Chest Stretch on Foam Roll - 10 reps - 1 sets - 1x daily - 7x weekly [facility] [address] [place], [place] [address] Phone # [phone] Fax [phone]
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
## 2019-10-16 ENCOUNTER — Ambulatory Visit: Payer: 59 | Admitting: Physical Therapy

## 2019-10-18 ENCOUNTER — Encounter (INDEPENDENT_AMBULATORY_CARE_PROVIDER_SITE_OTHER): Payer: Self-pay

## 2019-10-24 ENCOUNTER — Encounter (INDEPENDENT_AMBULATORY_CARE_PROVIDER_SITE_OTHER): Payer: Self-pay

## 2019-10-24 ENCOUNTER — Encounter (INDEPENDENT_AMBULATORY_CARE_PROVIDER_SITE_OTHER): Payer: 59 | Admitting: Bariatrics

## 2019-10-24 ENCOUNTER — Encounter (INDEPENDENT_AMBULATORY_CARE_PROVIDER_SITE_OTHER): Payer: Self-pay | Admitting: Bariatrics

## 2019-10-24 ENCOUNTER — Other Ambulatory Visit: Payer: Self-pay

## 2019-10-29 ENCOUNTER — Encounter (INDEPENDENT_AMBULATORY_CARE_PROVIDER_SITE_OTHER): Payer: Self-pay | Admitting: Bariatrics

## 2019-10-29 NOTE — Progress Notes (Signed)
This encounter was created in error - please disregard.

## 2019-10-30 ENCOUNTER — Other Ambulatory Visit: Payer: Self-pay

## 2019-10-30 ENCOUNTER — Ambulatory Visit: Payer: 59 | Attending: Obstetrics and Gynecology | Admitting: Physical Therapy

## 2019-10-30 ENCOUNTER — Encounter: Payer: Self-pay | Admitting: Physical Therapy

## 2019-10-30 DIAGNOSIS — M6281 Muscle weakness (generalized): Secondary | ICD-10-CM | POA: Diagnosis not present

## 2019-10-30 DIAGNOSIS — R252 Cramp and spasm: Secondary | ICD-10-CM | POA: Diagnosis not present

## 2019-10-30 NOTE — Patient Instructions (Signed)
Lubrication . Used for intercourse to reduce friction . Avoid ones that have glycerin, warming gels, tingling gels, icing or cooling gel, scented . Avoid parabens due to a preservative similar to female sex hormone . May need to be reapplied once or several times during sexual activity . Can be applied to both partners genitals prior to vaginal penetration to minimize friction or irritation . Prevent irritation and mucosal tears that cause post coital pain and increased the risk of vaginal and urinary tract infections . Oil-based lubricants cannot be used with condoms due to breaking them down.  Least likely to irritate vaginal tissue.  . Plant based-lubes are safe . Silicone-based lubrication are thicker and last long and used for post-menopausal women  Vaginal Lubricators Here is a list of some suggested lubricators you can use for intercourse. Use the most hypoallergenic product.  You can place on you or your partner.   Slippery Stuff  Sylk or Sliquid Natural H2O ( good  if frequent UTI's)  Blossom Organics (www.blossom-organics.com)  Coconut oil, olive oil   PJur Woman Nude- water based lubricant, amazon  [place] has an organic one  Yes lubricant- Retail buyer, [employer], [employer]  Olive and Bee intimate cream-  www.oliveandbee.com.au  Pink - [employer]  Hybird OASIS Things to avoid in lubricants are glycerin, warming gels, tingling gels, icing or cooling  gels, and scented gels.  Also avoid Vaseline. KY jelly, Replens, and Astroglide kills good bacteria(lactobacilli)  Things to avoid in the vaginal area . Do not use things to irritate the vulvar area . No lotions- see below . No soaps; can use Aveeno or Calendula cleanser if needed. Must be gentle . No deodorants . No douches . Good to sleep without underwear to let the vaginal area to air out . No scrubbing: spread the lips to let warm water rinse over labias  and pat dry  Creams that can be used on the [place]  Coconut oil, olive oil  Desert Havest Releveum or Desert Harvest Gele     STRETCHING THE PELVIC FLOOR MUSCLES NO DILATOR  Supplies . Vaginal lubricant . Mirror (optional) . Gloves (optional) Positioning . Start in a semi-reclined position with your head propped up. Bend your knees and place your thumb or finger at the vaginal opening. Procedure . Apply a moderate amount of lubricant on the outer skin of your vagina, the labia minora.  Apply additional lubricant to your finger. Marland Kitchen Spread the skin away from the vaginal opening. Place the end of your finger at the opening. . Do a maximum contraction of the pelvic floor muscles. Tighten the vagina and the anus maximally and relax. . When you know they are relaxed, gently and slowly insert your finger into your vagina, directing your finger slightly downward, for 2-3 inches of insertion. . Relax and stretch the 6 o'clock position . Hold each stretch for _2 min__ and repeat __1_ time with rest breaks of _1__ seconds between each stretch. . Repeat the stretching in the 4 o'clock and 8 o'clock positions. . Total time should be _6__ minutes, _1__ x per day.  Note the amount of theme your were able to achieve and your tolerance to your finger in your vagina. . Once you have accomplished the techniques you may try them in standing with one foot resting on the tub,  or in other positions.  This is a good stretch to do in the shower if you don't need to use lubricant.   [facility] [address], [place], [place] [address] Phone # [phone] Fax [phone]
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
## 2019-11-06 ENCOUNTER — Ambulatory Visit: Payer: 59 | Admitting: Physical Therapy

## 2019-11-06 ENCOUNTER — Other Ambulatory Visit: Payer: Self-pay

## 2019-11-06 ENCOUNTER — Encounter: Payer: Self-pay | Admitting: Physical Therapy

## 2019-11-06 ENCOUNTER — Ambulatory Visit (INDEPENDENT_AMBULATORY_CARE_PROVIDER_SITE_OTHER): Payer: Self-pay | Admitting: Bariatrics

## 2019-11-06 DIAGNOSIS — R252 Cramp and spasm: Secondary | ICD-10-CM | POA: Diagnosis not present

## 2019-11-06 DIAGNOSIS — M6281 Muscle weakness (generalized): Secondary | ICD-10-CM | POA: Diagnosis not present

## 2019-11-06 NOTE — Patient Instructions (Signed)
Access Code: [email or web address]  URL: [email or web address]  Date: 11/06/2019  Prepared by: Earlie Counts   Exercises Sidelying IT Band Foam Roll Mobilization - 10 reps - 1 sets - 1x daily - 7x weekly Piriformis Mobilization on Foam Roll - 10 reps - 1 sets - 1x daily - 7x weekly Quadriceps Mobilization with Foam Roll - 10 reps - 1 sets - 1x daily - 7x weekly Thoracic Mobilization on Foam Roll - Hands Clasped - 10 reps - 1 sets - 1x daily - 7x weekly Thoracic Stretch on Foam Roll - Hands Clasped - 10 reps - 1 sets - 1x daily - 7x weekly Supine Chest Stretch on Foam Roll - 10 reps - 1 sets - 1x daily - 7x weekly Bird Dog - 10 reps - 1 sets - 1x daily - 7x weekly Quadruped Fire Hydrant - 10 reps - 1 sets - 1x daily - 7x weekly [facility] [address], [place], [place] [address] Phone # [phone] Fax [phone]
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
## 2019-11-13 ENCOUNTER — Ambulatory Visit: Payer: 59 | Attending: Obstetrics and Gynecology | Admitting: Physical Therapy

## 2019-11-13 ENCOUNTER — Encounter: Payer: Self-pay | Admitting: Physical Therapy

## 2019-11-13 ENCOUNTER — Other Ambulatory Visit: Payer: Self-pay

## 2019-11-13 DIAGNOSIS — R252 Cramp and spasm: Secondary | ICD-10-CM | POA: Diagnosis not present

## 2019-11-13 DIAGNOSIS — M6281 Muscle weakness (generalized): Secondary | ICD-10-CM | POA: Insufficient documentation
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
## 2019-11-20 ENCOUNTER — Ambulatory Visit: Payer: 59 | Admitting: Physical Therapy

## 2019-11-20 ENCOUNTER — Other Ambulatory Visit: Payer: Self-pay

## 2019-11-20 ENCOUNTER — Encounter: Payer: Self-pay | Admitting: Physical Therapy

## 2019-11-20 DIAGNOSIS — M6281 Muscle weakness (generalized): Secondary | ICD-10-CM

## 2019-11-20 DIAGNOSIS — R252 Cramp and spasm: Secondary | ICD-10-CM
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
## 2019-11-27 ENCOUNTER — Ambulatory Visit: Payer: 59 | Admitting: Physical Therapy

## 2019-12-04 ENCOUNTER — Encounter: Payer: Self-pay | Admitting: Physical Therapy

## 2019-12-04 ENCOUNTER — Ambulatory Visit: Payer: 59 | Admitting: Physical Therapy

## 2019-12-04 ENCOUNTER — Other Ambulatory Visit: Payer: Self-pay

## 2019-12-04 DIAGNOSIS — M6281 Muscle weakness (generalized): Secondary | ICD-10-CM

## 2019-12-04 DIAGNOSIS — R252 Cramp and spasm: Secondary | ICD-10-CM
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
## 2019-12-11 ENCOUNTER — Other Ambulatory Visit: Payer: Self-pay

## 2019-12-11 ENCOUNTER — Encounter: Payer: Self-pay | Admitting: Physical Therapy

## 2019-12-11 ENCOUNTER — Ambulatory Visit: Payer: 59 | Admitting: Physical Therapy

## 2019-12-11 DIAGNOSIS — R252 Cramp and spasm: Secondary | ICD-10-CM | POA: Diagnosis not present

## 2019-12-11 DIAGNOSIS — M6281 Muscle weakness (generalized): Secondary | ICD-10-CM
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
## 2019-12-11 NOTE — Patient Instructions (Signed)
Access Code: [email or web address]  URL: [email or web address]  Date: 12/11/2019  Prepared by: Earlie Counts   Exercises Sidelying IT Band Foam Roll Mobilization - 10 reps - 1 sets - 1x daily - 7x weekly Piriformis Mobilization on Foam Roll - 10 reps - 1 sets - 1x daily - 7x weekly Quadriceps Mobilization with Foam Roll - 10 reps - 1 sets - 1x daily - 7x weekly Thoracic Mobilization on Foam Roll - Hands Clasped - 10 reps - 1 sets - 1x daily - 7x weekly Thoracic Stretch on Foam Roll - Hands Clasped - 10 reps - 1 sets - 1x daily - 7x weekly Supine Chest Stretch on Foam Roll - 10 reps - 1 sets - 1x daily - 7x weekly Bird Dog - 10 reps - 1 sets - 1x daily - 7x weekly Quadruped Fire Hydrant - 10 reps - 1 sets - 1x daily - 7x weekly Clamshell - 10 reps - 1 sets - 1x daily - 7x weekly Side Plank on Knees - 10 reps - 1 sets - 1x daily - 7x weekly Supine Piriformis Stretch with Leg Straight - 2 reps - 1 sets - 30 sec hold - 1x daily - 7x weekly Supine Piriformis Stretch Pulling Heel to Hip - 2 reps - 1 sets - 30 sec hold - 1x daily - 7x weekly Bridge with Hip Abduction and Resistance - 10 reps - 1 sets - 1x daily - 7x weekly Sidelying Quadriceps Stretch - 1 reps - 1 sets - 30 sec hold - 1x daily - 7x weekly Quadruped Leg Lifts - 10 reps - 1 sets - 1x daily - 7x weekly [facility] [address] [place], [place] [address] Phone # [phone] Fax [phone]

## 2019-12-18 ENCOUNTER — Encounter: Payer: 59 | Admitting: Physical Therapy

## 2019-12-22 ENCOUNTER — Encounter: Payer: Self-pay | Admitting: Internal Medicine

## 2019-12-24 ENCOUNTER — Other Ambulatory Visit: Payer: Self-pay | Admitting: Internal Medicine

## 2019-12-24 DIAGNOSIS — E063 Autoimmune thyroiditis: Secondary | ICD-10-CM

## 2019-12-24 DIAGNOSIS — E038 Other specified hypothyroidism: Secondary | ICD-10-CM

## 2019-12-24 DIAGNOSIS — R5383 Other fatigue: Secondary | ICD-10-CM

## 2019-12-25 ENCOUNTER — Other Ambulatory Visit: Payer: Self-pay | Admitting: Internal Medicine

## 2019-12-25 ENCOUNTER — Other Ambulatory Visit: Payer: Self-pay

## 2019-12-25 ENCOUNTER — Encounter: Payer: Self-pay | Admitting: Physical Therapy

## 2019-12-25 ENCOUNTER — Ambulatory Visit: Payer: 59 | Attending: Obstetrics and Gynecology | Admitting: Physical Therapy

## 2019-12-25 ENCOUNTER — Other Ambulatory Visit (INDEPENDENT_AMBULATORY_CARE_PROVIDER_SITE_OTHER): Payer: 59

## 2019-12-25 DIAGNOSIS — R739 Hyperglycemia, unspecified: Secondary | ICD-10-CM

## 2019-12-25 DIAGNOSIS — R252 Cramp and spasm: Secondary | ICD-10-CM | POA: Insufficient documentation

## 2019-12-25 DIAGNOSIS — E063 Autoimmune thyroiditis: Secondary | ICD-10-CM | POA: Diagnosis not present

## 2019-12-25 DIAGNOSIS — E038 Other specified hypothyroidism: Secondary | ICD-10-CM | POA: Diagnosis not present

## 2019-12-25 DIAGNOSIS — M6281 Muscle weakness (generalized): Secondary | ICD-10-CM | POA: Insufficient documentation

## 2019-12-25 DIAGNOSIS — R5383 Other fatigue: Secondary | ICD-10-CM | POA: Diagnosis not present

## 2019-12-25 LAB — T3, FREE: T3, Free: 3 pg/mL (ref 2.3–4.2)

## 2019-12-25 LAB — HEMOGLOBIN A1C: Hgb A1c MFr Bld: 5.3 % (ref 4.6–6.5)

## 2019-12-25 LAB — T4, FREE: Free T4: 1.01 ng/dL (ref 0.60–1.60)

## 2019-12-25 LAB — VITAMIN D 25 HYDROXY (VIT D DEFICIENCY, FRACTURES): VITD: 20.71 ng/mL — ABNORMAL LOW (ref 30.00–100.00)

## 2019-12-25 LAB — TSH: TSH: 2.73 u[IU]/mL (ref 0.35–4.50)
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
## 2019-12-26 ENCOUNTER — Other Ambulatory Visit: Payer: Self-pay | Admitting: Internal Medicine

## 2019-12-26 DIAGNOSIS — E038 Other specified hypothyroidism: Secondary | ICD-10-CM

## 2019-12-26 DIAGNOSIS — E063 Autoimmune thyroiditis: Secondary | ICD-10-CM

## 2019-12-27 LAB — THYROGLOBULIN LEVEL: Thyroglobulin: 10.6 ng/mL

## 2019-12-27 LAB — THYROGLOBULIN ANTIBODY: Thyroglobulin Ab: <1 IU/mL (ref <=1)

## 2019-12-28 DIAGNOSIS — L659 Nonscarring hair loss, unspecified: Secondary | ICD-10-CM | POA: Diagnosis not present

## 2019-12-28 DIAGNOSIS — H00012 Hordeolum externum right lower eyelid: Secondary | ICD-10-CM | POA: Diagnosis not present

## 2020-01-01 ENCOUNTER — Ambulatory Visit: Payer: 59 | Admitting: Physical Therapy

## 2020-01-08 ENCOUNTER — Ambulatory Visit: Payer: 59 | Admitting: Physical Therapy

## 2020-01-15 ENCOUNTER — Encounter: Payer: Self-pay | Admitting: Physical Therapy

## 2020-01-15 ENCOUNTER — Ambulatory Visit: Payer: 59 | Attending: Obstetrics and Gynecology | Admitting: Physical Therapy

## 2020-01-15 ENCOUNTER — Other Ambulatory Visit: Payer: Self-pay

## 2020-01-15 DIAGNOSIS — R252 Cramp and spasm: Secondary | ICD-10-CM | POA: Insufficient documentation

## 2020-01-15 DIAGNOSIS — M6281 Muscle weakness (generalized): Secondary | ICD-10-CM | POA: Insufficient documentation
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
## 2020-01-15 NOTE — Patient Instructions (Addendum)
Moisturizers . They are used in the vagina to hydrate the mucous membrane that make up the vaginal canal. . Designed to keep a more normal acid balance (ph) . Once placed in the vagina, it will last between two to three days.  . Use 2-3 times per week at bedtime  . Ingredients to avoid is glycerin and fragrance, can increase chance of infection . Should not be used just before sex due to causing irritation . Most are gels administered either in a tampon-shaped applicator or as a vaginal suppository. They are non-hormonal.   Types of Moisturizers  . Vitamin E vaginal suppositories- Whole foods, Amazon . Moist Again . Coconut oil- can break down condoms . Julva- (Do no use if on Tamoxifen) amazon . Yes moisturizer- amazon . NeuEve Silk , NeuEve Silver for menopausal or over 65 (if have severe vaginal atrophy or cancer treatments use NeuEve Silk for  1 month than move to NeuEve Silver)- Amazon, Neuve.com . Olive and Bee intimate cream- www.oliveandbee.com.au . Mae vaginal moisturizer- Amazon . Aloe .    Creams to use externally on the Vulva area  Desert Harvest Releveum (good for for cancer patients that had radiation to the area)- amazon or www.desertharvest.com  V-magic cream - amazon  Julva-amazon  Vital "V Wild Yam salve ( help moisturize and help with thinning vulvar area, does have Beeswax  MoodMaid Botanical Pro-Meno Wild Yam Cream- Amazon  Desert Harvest Gele  Cleo by Damiva labial moisturizer (Amazon,   Coconut or olive oil  aloe   Things to avoid in the vaginal area . Do not use things to irritate the vulvar area . No lotions just specialized creams for the vulva area- Neogyn, V-magic, No soaps; can use Aveeno or Calendula cleanser if needed. Must be gentle . No deodorants . No douches . Good to sleep without underwear to let the vaginal area to air out . No scrubbing: spread the lips to let warm water rinse over labias and pat dry  [facility] [address], [address] [place], [place] [address] Phone # [phone] Fax [phone]   

## 2020-01-16 ENCOUNTER — Ambulatory Visit: Payer: Self-pay

## 2020-01-16 ENCOUNTER — Other Ambulatory Visit: Payer: Self-pay | Admitting: Family Medicine

## 2020-01-16 DIAGNOSIS — R0789 Other chest pain: Secondary | ICD-10-CM

## 2020-01-22 ENCOUNTER — Encounter: Payer: Self-pay | Admitting: Physical Therapy

## 2020-01-22 ENCOUNTER — Ambulatory Visit: Payer: 59 | Admitting: Physical Therapy

## 2020-01-22 ENCOUNTER — Other Ambulatory Visit: Payer: Self-pay

## 2020-01-22 DIAGNOSIS — R252 Cramp and spasm: Secondary | ICD-10-CM | POA: Diagnosis not present

## 2020-01-22 DIAGNOSIS — M6281 Muscle weakness (generalized): Secondary | ICD-10-CM | POA: Diagnosis not present
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
## 2020-01-29 ENCOUNTER — Ambulatory Visit: Payer: 59 | Admitting: Physical Therapy

## 2020-02-05 ENCOUNTER — Encounter: Payer: 59 | Admitting: Physical Therapy

## 2020-02-11 ENCOUNTER — Telehealth: Payer: Self-pay | Admitting: Physical Therapy

## 2020-02-11 NOTE — Telephone Encounter (Signed)
Called patient to see if she is returning to therapy. Left a message.  Earlie Counts, PT @3 /12/2019@ 2:22 PM

## 2020-02-12 ENCOUNTER — Encounter: Payer: 59 | Admitting: Physical Therapy

## 2020-06-19 DIAGNOSIS — H0012 Chalazion right lower eyelid: Secondary | ICD-10-CM | POA: Diagnosis not present

## 2020-06-19 DIAGNOSIS — H02884 Meibomian gland dysfunction left upper eyelid: Secondary | ICD-10-CM | POA: Diagnosis not present

## 2020-06-19 DIAGNOSIS — H02881 Meibomian gland dysfunction right upper eyelid: Secondary | ICD-10-CM | POA: Diagnosis not present

## 2020-08-01 DIAGNOSIS — D1801 Hemangioma of skin and subcutaneous tissue: Secondary | ICD-10-CM | POA: Diagnosis not present

## 2020-08-01 DIAGNOSIS — L814 Other melanin hyperpigmentation: Secondary | ICD-10-CM | POA: Diagnosis not present

## 2020-08-01 DIAGNOSIS — I781 Nevus, non-neoplastic: Secondary | ICD-10-CM | POA: Diagnosis not present

## 2020-08-01 DIAGNOSIS — L719 Rosacea, unspecified: Secondary | ICD-10-CM | POA: Diagnosis not present

## 2020-08-01 DIAGNOSIS — L578 Other skin changes due to chronic exposure to nonionizing radiation: Secondary | ICD-10-CM | POA: Diagnosis not present

## 2020-08-01 DIAGNOSIS — D225 Melanocytic nevi of trunk: Secondary | ICD-10-CM | POA: Diagnosis not present

## 2020-08-01 DIAGNOSIS — D485 Neoplasm of uncertain behavior of skin: Secondary | ICD-10-CM | POA: Diagnosis not present

## 2020-08-08 ENCOUNTER — Telehealth: Payer: Self-pay | Admitting: Internal Medicine

## 2020-08-08 NOTE — Telephone Encounter (Signed)
Patient called to advise that she needs to do her Thyroid labs and that her Dermatologist has asked that her Vit D and Ferritin levels be checked as well.  Patient is requesting that these be added to her thyroid labs so that she can get her labs done at one time and in one place.  Please advise patient at [phone]

## 2020-08-08 NOTE — Telephone Encounter (Signed)
Please contact patient for appointment. Thank you.

## 2020-08-08 NOTE — Telephone Encounter (Signed)
She will need an appt - I have not seen her in more than 1 year.

## 2020-08-12 DIAGNOSIS — K582 Mixed irritable bowel syndrome: Secondary | ICD-10-CM | POA: Diagnosis not present

## 2020-08-12 DIAGNOSIS — E738 Other lactose intolerance: Secondary | ICD-10-CM | POA: Diagnosis not present

## 2020-08-12 DIAGNOSIS — E669 Obesity, unspecified: Secondary | ICD-10-CM | POA: Diagnosis not present

## 2020-08-12 DIAGNOSIS — K9041 Non-celiac gluten sensitivity: Secondary | ICD-10-CM | POA: Diagnosis not present

## 2020-08-12 DIAGNOSIS — R1032 Left lower quadrant pain: Secondary | ICD-10-CM | POA: Diagnosis not present

## 2020-08-22 DIAGNOSIS — G43109 Migraine with aura, not intractable, without status migrainosus: Secondary | ICD-10-CM | POA: Diagnosis not present

## 2020-08-22 DIAGNOSIS — L659 Nonscarring hair loss, unspecified: Secondary | ICD-10-CM | POA: Diagnosis not present

## 2020-08-22 DIAGNOSIS — Z889 Allergy status to unspecified drugs, medicaments and biological substances status: Secondary | ICD-10-CM | POA: Diagnosis not present

## 2020-08-22 DIAGNOSIS — K9041 Non-celiac gluten sensitivity: Secondary | ICD-10-CM | POA: Diagnosis not present

## 2020-09-05 DIAGNOSIS — L219 Seborrheic dermatitis, unspecified: Secondary | ICD-10-CM | POA: Diagnosis not present

## 2020-09-05 DIAGNOSIS — L649 Androgenic alopecia, unspecified: Secondary | ICD-10-CM | POA: Diagnosis not present

## 2020-09-07 ENCOUNTER — Encounter: Payer: Self-pay | Admitting: Physician Assistant

## 2020-09-07 ENCOUNTER — Other Ambulatory Visit: Payer: Self-pay | Admitting: Physician Assistant

## 2020-09-07 DIAGNOSIS — M069 Rheumatoid arthritis, unspecified: Secondary | ICD-10-CM

## 2020-09-07 DIAGNOSIS — E063 Autoimmune thyroiditis: Secondary | ICD-10-CM | POA: Insufficient documentation

## 2020-09-07 DIAGNOSIS — Z6831 Body mass index (BMI) 31.0-31.9, adult: Secondary | ICD-10-CM

## 2020-09-07 DIAGNOSIS — U071 COVID-19: Secondary | ICD-10-CM

## 2020-09-07 NOTE — Progress Notes (Signed)
I connected by phone with Alison Kelly on 09/07/2020 at 1:17 PM to discuss the potential use of a new treatment for mild to moderate COVID-19 viral infection in non-hospitalized patients.  This patient is a 34 y.o. female that meets the FDA criteria for Emergency Use Authorization of COVID monoclonal antibody casirivimab/imdevimab.  Has a (+) direct SARS-CoV-2 viral test result  Has mild or moderate COVID-19   Is NOT hospitalized due to COVID-19  Is within 10 days of symptom onset  Has at least one of the high risk factor(s) for progression to severe COVID-19 and/or hospitalization as defined in EUA.  Specific high risk criteria : BMI > 25 and Immunosuppressive Disease or Treatment   I have spoken and communicated the following to the patient or parent/caregiver regarding COVID monoclonal antibody treatment:  1. FDA has authorized the emergency use for the treatment of mild to moderate COVID-19 in adults and pediatric patients with positive results of direct SARS-CoV-2 viral testing who are 2 years of age and older weighing at least 40 kg, and who are at high risk for progressing to severe COVID-19 and/or hospitalization.  2. The significant known and potential risks and benefits of COVID monoclonal antibody, and the extent to which such potential risks and benefits are unknown.  3. Information on available alternative treatments and the risks and benefits of those alternatives, including clinical trials.  4. Patients treated with COVID monoclonal antibody should continue to self-isolate and use infection control measures (e.g., wear mask, isolate, social distance, avoid sharing personal items, clean and disinfect "high touch" surfaces, and frequent handwashing) according to CDC guidelines.   5. The patient or parent/caregiver has the option to accept or refuse COVID monoclonal antibody treatment.  After reviewing this information with the patient, the patient has agreed to receive  one of the available covid 19 monoclonal antibodies and will be provided an appropriate fact sheet prior to infusion.  Sx onset 9/24. Set up for infusion on 9/27 @ 8:30am. Directions given to [facility]. Pt is aware that insurance will be charged an infusion fee. Pt Is fully vaccinated.  [name] 09/07/2020 1:17 PM

## 2020-09-08 ENCOUNTER — Ambulatory Visit (HOSPITAL_COMMUNITY)
Admission: RE | Admit: 2020-09-08 | Discharge: 2020-09-08 | Disposition: A | Discharge status: Home / Self Care | Payer: 59 | Source: Ambulatory Visit | Attending: Pulmonary Disease | Admitting: Pulmonary Disease

## 2020-09-08 ENCOUNTER — Other Ambulatory Visit (HOSPITAL_COMMUNITY): Payer: Self-pay

## 2020-09-08 DIAGNOSIS — M069 Rheumatoid arthritis, unspecified: Secondary | ICD-10-CM | POA: Diagnosis not present

## 2020-09-08 DIAGNOSIS — E063 Autoimmune thyroiditis: Secondary | ICD-10-CM | POA: Diagnosis not present

## 2020-09-08 DIAGNOSIS — Z6831 Body mass index (BMI) 31.0-31.9, adult: Secondary | ICD-10-CM

## 2020-09-08 DIAGNOSIS — U071 COVID-19: Secondary | ICD-10-CM

## 2020-09-08 MED ORDER — SODIUM CHLORIDE 0.9 % IV SOLN: INTRAVENOUS | Status: DC | PRN

## 2020-09-08 MED ORDER — ALBUTEROL SULFATE HFA 108 (90 BASE) MCG/ACT IN AERS: 2.0000 | INHALATION_SPRAY | Freq: Once | RESPIRATORY_TRACT | Status: DC | PRN

## 2020-09-08 MED ORDER — DIPHENHYDRAMINE HCL 50 MG/ML IJ SOLN: 50.0000 mg | Freq: Once | INTRAMUSCULAR | Status: DC | PRN

## 2020-09-08 MED ORDER — EPINEPHRINE 0.3 MG/0.3ML IJ SOAJ: 0.3000 mg | Freq: Once | INTRAMUSCULAR | Status: DC | PRN

## 2020-09-08 MED ORDER — FAMOTIDINE IN NACL 20-0.9 MG/50ML-% IV SOLN: 20.0000 mg | Freq: Once | INTRAVENOUS | Status: DC | PRN

## 2020-09-08 MED ORDER — METHYLPREDNISOLONE SODIUM SUCC 125 MG IJ SOLR: 125.0000 mg | Freq: Once | INTRAMUSCULAR | Status: DC | PRN

## 2020-09-08 MED ORDER — SODIUM CHLORIDE 0.9 % IV SOLN
1200.0000 mg | Freq: Once | INTRAVENOUS | Status: AC
  Administered 2020-09-08: 1200 mg via INTRAVENOUS

## 2020-09-08 NOTE — Discharge Instructions (Signed)

## 2020-09-08 NOTE — Progress Notes (Signed)
  Diagnosis: COVID-19  Physician: Joya Gaskins  Procedure: [facility] Infusion [facility] Med: casirivimab\imdevimab infusion - Provided patient with casirivimab\imdevimab fact sheet for patients, parents and caregivers prior to infusion.  Complications: No immediate complications noted.  Discharge: Discharged home   Chyrel Masson 09/08/2020

## 2020-09-09 DIAGNOSIS — U071 COVID-19: Secondary | ICD-10-CM | POA: Diagnosis not present

## 2020-09-09 DIAGNOSIS — F418 Other specified anxiety disorders: Secondary | ICD-10-CM | POA: Diagnosis not present

## 2020-09-19 DIAGNOSIS — F418 Other specified anxiety disorders: Secondary | ICD-10-CM | POA: Diagnosis not present

## 2020-09-19 DIAGNOSIS — F321 Major depressive disorder, single episode, moderate: Secondary | ICD-10-CM | POA: Diagnosis not present

## 2020-10-13 DIAGNOSIS — F429 Obsessive-compulsive disorder, unspecified: Secondary | ICD-10-CM | POA: Diagnosis not present

## 2020-10-13 DIAGNOSIS — R634 Abnormal weight loss: Secondary | ICD-10-CM | POA: Diagnosis not present

## 2020-10-13 DIAGNOSIS — F418 Other specified anxiety disorders: Secondary | ICD-10-CM | POA: Diagnosis not present

## 2020-10-27 DIAGNOSIS — F429 Obsessive-compulsive disorder, unspecified: Secondary | ICD-10-CM | POA: Diagnosis not present

## 2020-11-04 DIAGNOSIS — E063 Autoimmune thyroiditis: Secondary | ICD-10-CM | POA: Diagnosis not present

## 2020-11-04 DIAGNOSIS — F418 Other specified anxiety disorders: Secondary | ICD-10-CM | POA: Diagnosis not present

## 2020-11-05 ENCOUNTER — Encounter: Payer: Self-pay | Admitting: Internal Medicine

## 2020-11-05 ENCOUNTER — Ambulatory Visit (INDEPENDENT_AMBULATORY_CARE_PROVIDER_SITE_OTHER): Payer: 59 | Admitting: Internal Medicine

## 2020-11-05 ENCOUNTER — Other Ambulatory Visit: Payer: Self-pay | Admitting: Internal Medicine

## 2020-11-05 ENCOUNTER — Other Ambulatory Visit: Payer: Self-pay

## 2020-11-05 VITALS — BP 130/80 | HR 77 | Ht 65.0 in | Wt 173.8 lb

## 2020-11-05 DIAGNOSIS — E063 Autoimmune thyroiditis: Secondary | ICD-10-CM

## 2020-11-05 DIAGNOSIS — M542 Cervicalgia: Secondary | ICD-10-CM

## 2020-11-05 DIAGNOSIS — E038 Other specified hypothyroidism: Secondary | ICD-10-CM

## 2020-11-05 MED ORDER — LEVOTHYROXINE SODIUM 25 MCG PO TABS
ORAL_TABLET | ORAL | 3 refills | Status: DC
Start: 2020-11-05 — End: 2020-11-05

## 2020-11-05 MED FILL — LEVOTHYROXINE SODIUM 25 MCG: 25 | 90 days supply | Qty: 90 | Fill #0

## 2020-11-05 NOTE — Patient Instructions (Addendum)
Please restart levothyroxine 25 mcg daily.  If we need to restart levothyroxine, please remember to take the thyroid hormone every day, with water, at least 30 minutes before breakfast, separated by at least 4 hours from: - acid reflux medications - calcium - iron - multivitamins  Please come back for labs in 5-6 weeks  Please come back for a follow-up appointment in one year.
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
## 2020-11-21 ENCOUNTER — Encounter: Payer: Self-pay | Admitting: Internal Medicine

## 2020-11-21 ENCOUNTER — Ambulatory Visit
Admission: RE | Admit: 2020-11-21 | Discharge: 2020-11-21 | Disposition: A | Discharge status: Home / Self Care | Payer: 59 | Source: Ambulatory Visit | Attending: Internal Medicine | Admitting: Internal Medicine

## 2020-11-21 DIAGNOSIS — E038 Other specified hypothyroidism: Secondary | ICD-10-CM

## 2020-11-21 DIAGNOSIS — E049 Nontoxic goiter, unspecified: Secondary | ICD-10-CM | POA: Diagnosis not present

## 2020-12-19 ENCOUNTER — Other Ambulatory Visit: Payer: 59

## 2020-12-30 DIAGNOSIS — F418 Other specified anxiety disorders: Secondary | ICD-10-CM | POA: Diagnosis not present

## 2020-12-30 DIAGNOSIS — U071 COVID-19: Secondary | ICD-10-CM | POA: Diagnosis not present

## 2021-01-05 DIAGNOSIS — Z13220 Encounter for screening for lipoid disorders: Secondary | ICD-10-CM | POA: Diagnosis not present

## 2021-01-05 DIAGNOSIS — U099 Post covid-19 condition, unspecified: Secondary | ICD-10-CM | POA: Diagnosis not present

## 2021-01-05 DIAGNOSIS — Z79899 Other long term (current) drug therapy: Secondary | ICD-10-CM | POA: Diagnosis not present

## 2021-01-05 DIAGNOSIS — F418 Other specified anxiety disorders: Secondary | ICD-10-CM | POA: Diagnosis not present

## 2021-01-05 DIAGNOSIS — Z8616 Personal history of COVID-19: Secondary | ICD-10-CM | POA: Diagnosis not present

## 2021-01-05 DIAGNOSIS — Z8639 Personal history of other endocrine, nutritional and metabolic disease: Secondary | ICD-10-CM | POA: Diagnosis not present

## 2021-01-05 DIAGNOSIS — B351 Tinea unguium: Secondary | ICD-10-CM | POA: Diagnosis not present

## 2021-01-12 DIAGNOSIS — N939 Abnormal uterine and vaginal bleeding, unspecified: Secondary | ICD-10-CM | POA: Diagnosis not present

## 2021-02-02 ENCOUNTER — Encounter: Payer: Self-pay | Admitting: Internal Medicine

## 2021-02-10 MED FILL — LEVOTHYROXINE SODIUM 25 MCG: 25 | 90 days supply | Qty: 90 | Fill #1

## 2021-02-18 DIAGNOSIS — N939 Abnormal uterine and vaginal bleeding, unspecified: Secondary | ICD-10-CM | POA: Diagnosis not present

## 2021-02-19 DIAGNOSIS — F418 Other specified anxiety disorders: Secondary | ICD-10-CM | POA: Diagnosis not present

## 2021-02-19 DIAGNOSIS — E559 Vitamin D deficiency, unspecified: Secondary | ICD-10-CM | POA: Diagnosis not present

## 2021-02-19 DIAGNOSIS — Z0000 Encounter for general adult medical examination without abnormal findings: Secondary | ICD-10-CM | POA: Diagnosis not present

## 2021-02-19 DIAGNOSIS — R5383 Other fatigue: Secondary | ICD-10-CM | POA: Diagnosis not present

## 2021-02-19 DIAGNOSIS — F429 Obsessive-compulsive disorder, unspecified: Secondary | ICD-10-CM | POA: Diagnosis not present

## 2021-03-09 DIAGNOSIS — N939 Abnormal uterine and vaginal bleeding, unspecified: Secondary | ICD-10-CM | POA: Diagnosis not present

## 2021-04-01 DIAGNOSIS — Z01818 Encounter for other preprocedural examination: Secondary | ICD-10-CM | POA: Diagnosis not present

## 2021-04-01 DIAGNOSIS — F429 Obsessive-compulsive disorder, unspecified: Secondary | ICD-10-CM | POA: Diagnosis not present

## 2021-04-01 DIAGNOSIS — N921 Excessive and frequent menstruation with irregular cycle: Secondary | ICD-10-CM | POA: Diagnosis not present

## 2021-04-01 DIAGNOSIS — B351 Tinea unguium: Secondary | ICD-10-CM | POA: Diagnosis not present

## 2021-04-01 DIAGNOSIS — F418 Other specified anxiety disorders: Secondary | ICD-10-CM | POA: Diagnosis not present

## 2021-05-01 ENCOUNTER — Other Ambulatory Visit (HOSPITAL_COMMUNITY): Payer: Self-pay

## 2021-05-01 MED ORDER — EMTRICITABINE-TENOFOVIR AF 200-25 MG PO TABS
1.0000 | ORAL_TABLET | Freq: Every day | ORAL | 0 refills | Status: DC
  Filled 2021-05-01: qty 5, 5d supply, fill #0

## 2021-05-01 MED ORDER — DOLUTEGRAVIR SODIUM 50 MG PO TABS
50.0000 mg | ORAL_TABLET | Freq: Every day | ORAL | 0 refills | Status: DC
  Filled 2021-05-01: qty 5, 5d supply, fill #0

## 2021-05-05 ENCOUNTER — Other Ambulatory Visit (HOSPITAL_COMMUNITY): Payer: Self-pay

## 2021-05-05 DIAGNOSIS — F418 Other specified anxiety disorders: Secondary | ICD-10-CM | POA: Diagnosis not present

## 2021-05-05 DIAGNOSIS — F429 Obsessive-compulsive disorder, unspecified: Secondary | ICD-10-CM | POA: Diagnosis not present

## 2021-05-05 DIAGNOSIS — Z79891 Long term (current) use of opiate analgesic: Secondary | ICD-10-CM | POA: Diagnosis not present

## 2021-05-05 DIAGNOSIS — F411 Generalized anxiety disorder: Secondary | ICD-10-CM | POA: Diagnosis not present

## 2021-05-05 MED ORDER — FLUVOXAMINE MALEATE 50 MG PO TABS
ORAL_TABLET | ORAL | 1 refills | Status: DC
Start: 2021-05-05 — End: 2021-10-16
  Filled 2021-05-05: qty 49, 28d supply, fill #0

## 2021-05-07 ENCOUNTER — Other Ambulatory Visit (HOSPITAL_COMMUNITY): Payer: Self-pay

## 2021-05-07 MED ORDER — CARESTART COVID-19 HOME TEST VI KIT
PACK | 0 refills | Status: DC
  Filled 2021-05-07: qty 4, 4d supply, fill #0

## 2021-05-18 ENCOUNTER — Other Ambulatory Visit (HOSPITAL_COMMUNITY): Payer: Self-pay

## 2021-05-19 ENCOUNTER — Other Ambulatory Visit (HOSPITAL_COMMUNITY): Payer: Self-pay

## 2021-05-29 ENCOUNTER — Other Ambulatory Visit (HOSPITAL_COMMUNITY): Payer: Self-pay

## 2021-05-29 DIAGNOSIS — F429 Obsessive-compulsive disorder, unspecified: Secondary | ICD-10-CM | POA: Diagnosis not present

## 2021-05-29 DIAGNOSIS — F411 Generalized anxiety disorder: Secondary | ICD-10-CM | POA: Diagnosis not present

## 2021-05-29 MED ORDER — FLUVOXAMINE MALEATE 50 MG PO TABS
150.0000 mg | ORAL_TABLET | Freq: Every day | ORAL | 1 refills | Status: DC
  Filled 2021-05-29: qty 90, 30d supply, fill #0
  Filled 2021-07-01: qty 90, 30d supply, fill #1

## 2021-06-01 ENCOUNTER — Other Ambulatory Visit (HOSPITAL_COMMUNITY): Payer: Self-pay

## 2021-06-01 MED FILL — Levothyroxine Sodium Tab 25 MCG: ORAL | 90 days supply | Qty: 90 | Fill #0 | Status: AC

## 2021-07-01 ENCOUNTER — Other Ambulatory Visit (HOSPITAL_COMMUNITY): Payer: Self-pay

## 2021-07-17 ENCOUNTER — Other Ambulatory Visit (HOSPITAL_COMMUNITY): Payer: Self-pay

## 2021-07-17 DIAGNOSIS — F429 Obsessive-compulsive disorder, unspecified: Secondary | ICD-10-CM | POA: Diagnosis not present

## 2021-07-17 DIAGNOSIS — F411 Generalized anxiety disorder: Secondary | ICD-10-CM | POA: Diagnosis not present

## 2021-07-17 MED ORDER — FLUVOXAMINE MALEATE 50 MG PO TABS
150.0000 mg | ORAL_TABLET | Freq: Every day | ORAL | 2 refills | Status: DC
Start: 2021-07-17 — End: 2021-10-16
  Filled 2021-07-17 – 2021-07-31 (×2): qty 90, 30d supply, fill #0
  Filled 2021-09-01: qty 90, 30d supply, fill #1
  Filled 2021-09-29: qty 90, 30d supply, fill #2

## 2021-07-31 ENCOUNTER — Other Ambulatory Visit (HOSPITAL_COMMUNITY): Payer: Self-pay

## 2021-08-06 DIAGNOSIS — F411 Generalized anxiety disorder: Secondary | ICD-10-CM | POA: Diagnosis not present

## 2021-08-06 DIAGNOSIS — F429 Obsessive-compulsive disorder, unspecified: Secondary | ICD-10-CM | POA: Diagnosis not present

## 2021-08-21 DIAGNOSIS — G43909 Migraine, unspecified, not intractable, without status migrainosus: Secondary | ICD-10-CM | POA: Diagnosis not present

## 2021-08-26 DIAGNOSIS — R519 Headache, unspecified: Secondary | ICD-10-CM | POA: Diagnosis not present

## 2021-08-26 DIAGNOSIS — Z8669 Personal history of other diseases of the nervous system and sense organs: Secondary | ICD-10-CM | POA: Diagnosis not present

## 2021-08-27 ENCOUNTER — Encounter (HOSPITAL_COMMUNITY): Payer: Self-pay | Admitting: Emergency Medicine

## 2021-08-27 ENCOUNTER — Emergency Department (HOSPITAL_COMMUNITY)
Admission: EM | Admit: 2021-08-27 | Discharge: 2021-08-27 | Disposition: A | Discharge status: Home / Self Care | Payer: 59 | Attending: Emergency Medicine | Admitting: Emergency Medicine

## 2021-08-27 ENCOUNTER — Emergency Department (HOSPITAL_COMMUNITY): Payer: 59

## 2021-08-27 ENCOUNTER — Other Ambulatory Visit: Payer: Self-pay

## 2021-08-27 DIAGNOSIS — E039 Hypothyroidism, unspecified: Secondary | ICD-10-CM | POA: Insufficient documentation

## 2021-08-27 DIAGNOSIS — Z79899 Other long term (current) drug therapy: Secondary | ICD-10-CM | POA: Insufficient documentation

## 2021-08-27 DIAGNOSIS — Z85828 Personal history of other malignant neoplasm of skin: Secondary | ICD-10-CM | POA: Diagnosis not present

## 2021-08-27 DIAGNOSIS — G43901 Migraine, unspecified, not intractable, with status migrainosus: Secondary | ICD-10-CM | POA: Insufficient documentation

## 2021-08-27 DIAGNOSIS — R42 Dizziness and giddiness: Secondary | ICD-10-CM | POA: Diagnosis not present

## 2021-08-27 DIAGNOSIS — R519 Headache, unspecified: Secondary | ICD-10-CM | POA: Diagnosis not present

## 2021-08-27 DIAGNOSIS — G43909 Migraine, unspecified, not intractable, without status migrainosus: Secondary | ICD-10-CM | POA: Diagnosis not present

## 2021-08-27 LAB — CBC WITH DIFFERENTIAL/PLATELET
Abs Immature Granulocytes: 0.03 10*3/uL (ref 0.00–0.07)
Basophils Absolute: 0 10*3/uL (ref 0.0–0.1)
Basophils Relative: 1 %
Eosinophils Absolute: 0.1 10*3/uL (ref 0.0–0.5)
Eosinophils Relative: 1 %
HCT: 40.1 % (ref 36.0–46.0)
Hemoglobin: 13.1 g/dL (ref 12.0–15.0)
Immature Granulocytes: 1 %
Lymphocytes Relative: 33 %
Lymphs Abs: 2 10*3/uL (ref 0.7–4.0)
MCH: 30.1 pg (ref 26.0–34.0)
MCHC: 32.7 g/dL (ref 30.0–36.0)
MCV: 92.2 fL (ref 80.0–100.0)
Monocytes Absolute: 0.3 10*3/uL (ref 0.1–1.0)
Monocytes Relative: 5 %
Neutro Abs: 3.5 10*3/uL (ref 1.7–7.7)
Neutrophils Relative %: 59 %
Platelets: 257 10*3/uL (ref 150–400)
RBC: 4.35 MIL/uL (ref 3.87–5.11)
RDW: 13.2 % (ref 11.5–15.5)
WBC: 5.9 10*3/uL (ref 4.0–10.5)
nRBC: 0 % (ref 0.0–0.2)

## 2021-08-27 LAB — BASIC METABOLIC PANEL
Anion gap: 9 (ref 5–15)
BUN: 9 mg/dL (ref 6–20)
CO2: 23 mmol/L (ref 22–32)
Calcium: 9.1 mg/dL (ref 8.9–10.3)
Chloride: 103 mmol/L (ref 98–111)
Creatinine, Ser: 0.83 mg/dL (ref 0.44–1.00)
GFR, Estimated: >60 mL/min (ref >60)
Glucose, Bld: 92 mg/dL (ref 70–99)
Potassium: 4.2 mmol/L (ref 3.5–5.1)
Sodium: 135 mmol/L (ref 135–145)

## 2021-08-27 LAB — TSH: TSH: 1.501 u[IU]/mL (ref 0.350–4.500)

## 2021-08-27 LAB — I-STAT BETA HCG BLOOD, ED (MC, WL, AP ONLY): I-stat hCG, quantitative: <5 m[IU]/mL (ref <5)

## 2021-08-27 MED ORDER — DIPHENHYDRAMINE HCL 50 MG/ML IJ SOLN
12.5000 mg | Freq: Once | INTRAMUSCULAR | Status: AC
  Administered 2021-08-27: 12.5 mg via INTRAVENOUS
  Filled 2021-08-27: qty 1

## 2021-08-27 MED ORDER — METOCLOPRAMIDE HCL 5 MG/ML IJ SOLN
5.0000 mg | Freq: Once | INTRAMUSCULAR | Status: AC
  Administered 2021-08-27: 5 mg via INTRAVENOUS
  Filled 2021-08-27: qty 2

## 2021-08-27 MED ORDER — KETOROLAC TROMETHAMINE 30 MG/ML IJ SOLN
15.0000 mg | Freq: Once | INTRAMUSCULAR | Status: AC
  Administered 2021-08-27: 15 mg via INTRAVENOUS
  Filled 2021-08-27: qty 1

## 2021-08-27 MED ORDER — SODIUM CHLORIDE 0.9 % IV BOLUS
500.0000 mL | Freq: Once | INTRAVENOUS | Status: AC
  Administered 2021-08-27: 500 mL via INTRAVENOUS

## 2021-08-27 NOTE — ED Triage Notes (Addendum)
Pt states she has had a migraine for 14 days. Pt just started Fiocet yesterday for migraines. Pt states she feels like her left eye is swelling- this seems to have started after taking the [place]. States she is having some "word finding trouble." Speech is clear at this time. Pt has felt very nauseous. Neuro intact.

## 2021-08-27 NOTE — ED Provider Notes (Signed)
Emergency Medicine Provider Triage Evaluation Note  Alison Kelly , a 35 y.o. female  was evaluated in triage.  Pt complains of migraine x2 weeks.  Patient reports that she previously did not get migraines. Hx of Hashimoto's thyroiditis.  Was given Fioricet by her primary care provider yesterday and feels as though this hasn't helped and has bothered her left eye.  Reports difficulty finding words a week ago but has improved since then.  Review of Systems  Positive: Headache, nausea, vomiting Negative: Photophobia/phonophobia,   Physical Exam  BP (!) 159/89 (BP Location: Left Arm)   Pulse 73   Temp 98.5 F (36.9 C) (Oral)   Resp 16   SpO2 100%  Gen:   Awake, no distress   Resp:  Normal effort  MSK:   Moves extremities without difficulty  Other:  PERRLA, 5/5 strength uper and lower extremities, speech clear, appears uncomfortable.   Medical Decision Making  Medically screening exam initiated at 10:06 AM.  Appropriate orders placed.  Alison Kelly was informed that the remainder of the evaluation will be completed by another provider, this initial triage assessment does not replace that evaluation, and the importance of remaining in the ED until their evaluation is complete.     Rhae Hammock, PA-C 08/27/21 1010    Lorelle Gibbs, [place] 08/27/21 1306
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
## 2021-08-27 NOTE — Discharge Instructions (Addendum)

## 2021-09-01 ENCOUNTER — Other Ambulatory Visit (HOSPITAL_COMMUNITY): Payer: Self-pay

## 2021-09-29 MED FILL — Levothyroxine Sodium Tab 25 MCG: ORAL | 90 days supply | Qty: 90 | Fill #1 | Status: AC

## 2021-09-30 ENCOUNTER — Other Ambulatory Visit (HOSPITAL_COMMUNITY): Payer: Self-pay

## 2021-10-08 ENCOUNTER — Ambulatory Visit: Payer: 59 | Attending: Family Medicine | Admitting: Physical Therapy

## 2021-10-08 ENCOUNTER — Other Ambulatory Visit: Payer: Self-pay

## 2021-10-08 ENCOUNTER — Encounter: Payer: Self-pay | Admitting: Physical Therapy

## 2021-10-08 DIAGNOSIS — G44209 Tension-type headache, unspecified, not intractable: Secondary | ICD-10-CM | POA: Insufficient documentation

## 2021-10-08 DIAGNOSIS — R293 Abnormal posture: Secondary | ICD-10-CM | POA: Insufficient documentation

## 2021-10-08 DIAGNOSIS — M542 Cervicalgia: Secondary | ICD-10-CM | POA: Diagnosis not present

## 2021-10-08 NOTE — Patient Instructions (Signed)
Access Code: [email or web address] URL: [email or web address] Date: 10/08/2021 Prepared by: Starr Lake  Exercises Seated Upper Trapezius Stretch - 1 x daily - 7 x weekly - 2 sets - 2 reps - 30 seconds hold Gentle Levator Scapulae Stretch - 1 x daily - 7 x weekly - 2 sets - 2 reps - 30 seconds hold Supine Chin Tuck with Towel - 1 x daily - 7 x weekly - 2 sets - 10 reps - 5 hold Standing Cervical Retraction - 1 x daily - 7 x weekly - 2 sets - 10 reps - 5 sec hold Seated Scapular Retraction - 1 x daily - 7 x weekly - 2 sets - 10 reps - 5 seconds hold Scapular retraction with ER (MONEY) - 1 x daily - 7 x weekly - 2 sets - 10 reps
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
## 2021-10-16 ENCOUNTER — Other Ambulatory Visit (HOSPITAL_COMMUNITY): Payer: Self-pay

## 2021-10-16 DIAGNOSIS — F411 Generalized anxiety disorder: Secondary | ICD-10-CM | POA: Diagnosis not present

## 2021-10-16 DIAGNOSIS — F429 Obsessive-compulsive disorder, unspecified: Secondary | ICD-10-CM | POA: Diagnosis not present

## 2021-10-16 MED ORDER — FLUVOXAMINE MALEATE 100 MG PO TABS
200.0000 mg | ORAL_TABLET | Freq: Every day | ORAL | 1 refills | Status: DC
  Filled 2021-10-16 – 2021-10-30 (×2): qty 60, 30d supply, fill #0
  Filled 2021-11-27: qty 60, 30d supply, fill #1

## 2021-10-20 ENCOUNTER — Ambulatory Visit: Payer: 59 | Admitting: Physical Therapy

## 2021-10-26 ENCOUNTER — Other Ambulatory Visit (HOSPITAL_COMMUNITY): Payer: Self-pay

## 2021-10-27 ENCOUNTER — Ambulatory Visit: Payer: 59 | Attending: Family Medicine | Admitting: Physical Therapy

## 2021-10-27 ENCOUNTER — Other Ambulatory Visit: Payer: Self-pay

## 2021-10-27 ENCOUNTER — Encounter: Payer: Self-pay | Admitting: Physical Therapy

## 2021-10-27 DIAGNOSIS — R252 Cramp and spasm: Secondary | ICD-10-CM | POA: Insufficient documentation

## 2021-10-27 DIAGNOSIS — M6281 Muscle weakness (generalized): Secondary | ICD-10-CM | POA: Insufficient documentation

## 2021-10-27 DIAGNOSIS — G44209 Tension-type headache, unspecified, not intractable: Secondary | ICD-10-CM | POA: Diagnosis not present

## 2021-10-27 DIAGNOSIS — R293 Abnormal posture: Secondary | ICD-10-CM | POA: Insufficient documentation

## 2021-10-27 DIAGNOSIS — M542 Cervicalgia: Secondary | ICD-10-CM | POA: Insufficient documentation
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
## 2021-10-30 ENCOUNTER — Other Ambulatory Visit (HOSPITAL_COMMUNITY): Payer: Self-pay

## 2021-10-30 DIAGNOSIS — F429 Obsessive-compulsive disorder, unspecified: Secondary | ICD-10-CM | POA: Diagnosis not present

## 2021-10-30 DIAGNOSIS — Z7721 Contact with and (suspected) exposure to potentially hazardous body fluids: Secondary | ICD-10-CM | POA: Diagnosis not present

## 2021-10-30 DIAGNOSIS — E063 Autoimmune thyroiditis: Secondary | ICD-10-CM | POA: Diagnosis not present

## 2021-10-30 DIAGNOSIS — Z760 Encounter for issue of repeat prescription: Secondary | ICD-10-CM | POA: Diagnosis not present

## 2021-11-13 ENCOUNTER — Other Ambulatory Visit (HOSPITAL_COMMUNITY): Payer: Self-pay

## 2021-11-13 ENCOUNTER — Encounter: Payer: Self-pay | Admitting: Internal Medicine

## 2021-11-13 ENCOUNTER — Ambulatory Visit (INDEPENDENT_AMBULATORY_CARE_PROVIDER_SITE_OTHER): Payer: 59 | Admitting: Internal Medicine

## 2021-11-13 ENCOUNTER — Other Ambulatory Visit: Payer: Self-pay

## 2021-11-13 VITALS — BP 110/72 | HR 72 | Ht 65.0 in | Wt 183.6 lb

## 2021-11-13 DIAGNOSIS — E063 Autoimmune thyroiditis: Secondary | ICD-10-CM

## 2021-11-13 DIAGNOSIS — E038 Other specified hypothyroidism: Secondary | ICD-10-CM | POA: Diagnosis not present

## 2021-11-13 DIAGNOSIS — M542 Cervicalgia: Secondary | ICD-10-CM

## 2021-11-13 MED ORDER — LEVOTHYROXINE SODIUM 25 MCG PO TABS
25.0000 ug | ORAL_TABLET | Freq: Every day | ORAL | 3 refills | Status: DC
  Filled 2021-11-13 – 2022-01-18 (×3): qty 90, 90d supply, fill #0
  Filled 2022-04-27: qty 90, 90d supply, fill #1
  Filled 2022-07-12: qty 90, 90d supply, fill #2
  Filled 2022-11-10: qty 90, 90d supply, fill #3
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
## 2021-11-13 NOTE — Patient Instructions (Addendum)
Please restart levothyroxine 25 mcg daily.  Please take the thyroid hormone every day, with water, at least 30 minutes before breakfast, separated by at least 4 hours from: - acid reflux medications - calcium - iron - multivitamins  Please come back for a follow-up appointment in one year.

## 2021-11-27 ENCOUNTER — Other Ambulatory Visit (HOSPITAL_COMMUNITY): Payer: Self-pay

## 2021-12-02 DIAGNOSIS — H5213 Myopia, bilateral: Secondary | ICD-10-CM | POA: Diagnosis not present

## 2021-12-02 DIAGNOSIS — H52203 Unspecified astigmatism, bilateral: Secondary | ICD-10-CM | POA: Diagnosis not present

## 2021-12-25 ENCOUNTER — Other Ambulatory Visit (HOSPITAL_COMMUNITY): Payer: Self-pay

## 2021-12-25 MED ORDER — FLUVOXAMINE MALEATE 100 MG PO TABS
200.0000 mg | ORAL_TABLET | Freq: Every day | ORAL | 0 refills | Status: DC
  Filled 2021-12-25: qty 60, 30d supply, fill #0

## 2021-12-29 ENCOUNTER — Other Ambulatory Visit (HOSPITAL_COMMUNITY): Payer: Self-pay

## 2022-01-01 ENCOUNTER — Other Ambulatory Visit (HOSPITAL_COMMUNITY): Payer: Self-pay

## 2022-01-01 DIAGNOSIS — F411 Generalized anxiety disorder: Secondary | ICD-10-CM | POA: Diagnosis not present

## 2022-01-01 DIAGNOSIS — F429 Obsessive-compulsive disorder, unspecified: Secondary | ICD-10-CM | POA: Diagnosis not present

## 2022-01-01 MED ORDER — FLUVOXAMINE MALEATE 100 MG PO TABS
200.0000 mg | ORAL_TABLET | Freq: Every day | ORAL | 2 refills | Status: DC
  Filled 2022-01-29: qty 60, 30d supply, fill #0
  Filled 2022-03-04: qty 60, 30d supply, fill #1
  Filled 2022-04-01: qty 60, 30d supply, fill #2

## 2022-01-06 ENCOUNTER — Other Ambulatory Visit (HOSPITAL_COMMUNITY): Payer: Self-pay

## 2022-01-18 ENCOUNTER — Other Ambulatory Visit (HOSPITAL_COMMUNITY): Payer: Self-pay

## 2022-01-29 ENCOUNTER — Other Ambulatory Visit (HOSPITAL_COMMUNITY): Payer: Self-pay

## 2022-03-04 ENCOUNTER — Other Ambulatory Visit (HOSPITAL_COMMUNITY): Payer: Self-pay

## 2022-03-04 ENCOUNTER — Telehealth: Payer: 59 | Admitting: Emergency Medicine

## 2022-03-04 DIAGNOSIS — E039 Hypothyroidism, unspecified: Secondary | ICD-10-CM | POA: Diagnosis not present

## 2022-03-04 DIAGNOSIS — R55 Syncope and collapse: Secondary | ICD-10-CM

## 2022-03-04 DIAGNOSIS — R42 Dizziness and giddiness: Secondary | ICD-10-CM | POA: Diagnosis not present

## 2022-03-04 NOTE — Progress Notes (Signed)
-  NO CHARGE- ? ?Referred to [facility] for evaluation of syncope x 2.  States she has an appointment in 2 hours with her doctor.  Is with a family member now.  Feel that she is safe to wait to be seen by her doctor. ?

## 2022-03-04 NOTE — Patient Instructions (Signed)
Please follow-up with your doctor. ? ?Go to the ER if you have worsening symptoms. ?

## 2022-03-09 DIAGNOSIS — R42 Dizziness and giddiness: Secondary | ICD-10-CM | POA: Diagnosis not present

## 2022-03-12 ENCOUNTER — Encounter: Payer: Self-pay | Admitting: Neurology
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
## 2022-03-15 ENCOUNTER — Encounter: Payer: Self-pay | Admitting: Neurology

## 2022-03-15 ENCOUNTER — Ambulatory Visit (INDEPENDENT_AMBULATORY_CARE_PROVIDER_SITE_OTHER): Payer: 59 | Admitting: Neurology

## 2022-03-15 VITALS — BP 117/79 | HR 76 | Ht 65.0 in | Wt 198.0 lb

## 2022-03-15 DIAGNOSIS — R002 Palpitations: Secondary | ICD-10-CM | POA: Diagnosis not present

## 2022-03-15 DIAGNOSIS — R519 Headache, unspecified: Secondary | ICD-10-CM | POA: Diagnosis not present

## 2022-03-15 DIAGNOSIS — R55 Syncope and collapse: Secondary | ICD-10-CM

## 2022-03-15 DIAGNOSIS — M7918 Myalgia, other site: Secondary | ICD-10-CM | POA: Diagnosis not present

## 2022-03-15 NOTE — Patient Instructions (Signed)
For further evaluation of daily headache, will check MRI of brain with and without contrast ?Will refer to [facility] for dry needling ?Will refer to Dr. Hulan Saas of [place] for neck pain ?Will refer to cardiology for palpitations and near syncope ?Limit use of pain relievers to no more than 2 days out of week to prevent risk of rebound or medication-overuse headache. ?Follow up 4 months. ?

## 2022-03-25 NOTE — Progress Notes (Deleted)
? ?   Benito Mccreedy D.Merril Abbe ?[facility] ?[place] ?Phone: [phone] ?  ?Assessment and Plan:   ?  ?There are no diagnoses linked to this encounter.  ?*** ?  ?Pertinent previous records reviewed include *** ?  ?Follow Up: ***  ? ?  ?Subjective:   ?I, Pincus Badder, am serving as a Education administrator for [employer] ? ?Chief Complaint: neck pain  ? ?HPI:  ? ?03/26/2022 ?Patient is a 36 year old female complaining of neck pain. Patient states ? ?Relevant Historical Information: *** ? ?Additional pertinent review of systems negative. ? ? ?Current Outpatient Medications:  ?  Cholecalciferol (D3 PO), Take by mouth., Disp: , Rfl:  ?  fluvoxaMINE (LUVOX) 100 MG tablet, Take 2 tablets (200 mg total) by mouth daily., Disp: 60 tablet, Rfl: 2 ?  levothyroxine (SYNTHROID) 25 MCG tablet, Take 1 tablet (25 mcg total) by mouth daily before breakfast., Disp: 90 tablet, Rfl: 3 ?  pyridoxine (B-6) 100 MG tablet, Take 100 mg by mouth daily., Disp: , Rfl:  ?  traMADol (ULTRAM) 50 MG tablet, Take 1 tablet (50 mg total) by mouth every 6 (six) hours as needed., Disp: 8 tablet, Rfl: 0 ?  vitamin B-12 (CYANOCOBALAMIN) 500 MCG tablet, Take 500 mcg by mouth daily., Disp: , Rfl:   ? ?Objective:   ?  ?There were no vitals filed for this visit.  ?  ?There is no height or weight on file to calculate BMI.  ?  ?Physical Exam:   ? ?*** ? ? ?Electronically signed by:  ?Benito Mccreedy D.Merril Abbe ?[facility] ?10:32 AM 03/25/22 ?

## 2022-03-26 ENCOUNTER — Ambulatory Visit: Payer: 59 | Admitting: Sports Medicine

## 2022-04-01 ENCOUNTER — Other Ambulatory Visit (HOSPITAL_COMMUNITY): Payer: Self-pay

## 2022-04-02 ENCOUNTER — Other Ambulatory Visit (HOSPITAL_COMMUNITY): Payer: Self-pay

## 2022-04-02 DIAGNOSIS — F411 Generalized anxiety disorder: Secondary | ICD-10-CM | POA: Diagnosis not present

## 2022-04-02 DIAGNOSIS — F429 Obsessive-compulsive disorder, unspecified: Secondary | ICD-10-CM | POA: Diagnosis not present

## 2022-04-02 MED ORDER — FLUVOXAMINE MALEATE 100 MG PO TABS
200.0000 mg | ORAL_TABLET | Freq: Every day | ORAL | 1 refills | Status: DC
  Filled 2022-04-27: qty 180, 90d supply, fill #0
  Filled 2022-07-12: qty 180, 90d supply, fill #1

## 2022-04-09 ENCOUNTER — Ambulatory Visit: Payer: 59 | Admitting: Sports Medicine

## 2022-04-12 ENCOUNTER — Ambulatory Visit: Payer: 59 | Admitting: Cardiology

## 2022-04-16 DIAGNOSIS — L719 Rosacea, unspecified: Secondary | ICD-10-CM | POA: Diagnosis not present

## 2022-04-16 DIAGNOSIS — Z85820 Personal history of malignant melanoma of skin: Secondary | ICD-10-CM | POA: Diagnosis not present

## 2022-04-16 DIAGNOSIS — D239 Other benign neoplasm of skin, unspecified: Secondary | ICD-10-CM | POA: Diagnosis not present

## 2022-04-16 DIAGNOSIS — L910 Hypertrophic scar: Secondary | ICD-10-CM | POA: Diagnosis not present

## 2022-04-27 ENCOUNTER — Other Ambulatory Visit (HOSPITAL_COMMUNITY): Payer: Self-pay

## 2022-05-24 DIAGNOSIS — K9041 Non-celiac gluten sensitivity: Secondary | ICD-10-CM | POA: Insufficient documentation

## 2022-05-24 DIAGNOSIS — Z91018 Allergy to other foods: Secondary | ICD-10-CM | POA: Insufficient documentation

## 2022-05-24 DIAGNOSIS — E559 Vitamin D deficiency, unspecified: Secondary | ICD-10-CM | POA: Insufficient documentation

## 2022-05-24 DIAGNOSIS — F32A Depression, unspecified: Secondary | ICD-10-CM | POA: Insufficient documentation

## 2022-05-24 DIAGNOSIS — R630 Anorexia: Secondary | ICD-10-CM | POA: Insufficient documentation

## 2022-05-24 DIAGNOSIS — C4490 Unspecified malignant neoplasm of skin, unspecified: Secondary | ICD-10-CM | POA: Insufficient documentation

## 2022-05-24 DIAGNOSIS — E039 Hypothyroidism, unspecified: Secondary | ICD-10-CM | POA: Insufficient documentation

## 2022-05-24 DIAGNOSIS — K5900 Constipation, unspecified: Secondary | ICD-10-CM | POA: Insufficient documentation

## 2022-05-24 DIAGNOSIS — M2550 Pain in unspecified joint: Secondary | ICD-10-CM | POA: Insufficient documentation

## 2022-05-25 ENCOUNTER — Ambulatory Visit: Payer: 59 | Admitting: Cardiology

## 2022-06-03 ENCOUNTER — Encounter (HOSPITAL_COMMUNITY): Payer: 59

## 2022-06-03 ENCOUNTER — Other Ambulatory Visit (HOSPITAL_COMMUNITY): Payer: Self-pay

## 2022-06-03 MED ORDER — FLUVOXAMINE MALEATE 50 MG PO TABS
50.0000 mg | ORAL_TABLET | Freq: Every day | ORAL | 0 refills | Status: DC
  Filled 2022-06-03: qty 30, 30d supply, fill #0

## 2022-06-05 ENCOUNTER — Other Ambulatory Visit: Payer: Self-pay

## 2022-06-05 DIAGNOSIS — I781 Nevus, non-neoplastic: Secondary | ICD-10-CM

## 2022-06-16 ENCOUNTER — Other Ambulatory Visit (HOSPITAL_COMMUNITY): Payer: Self-pay

## 2022-07-02 ENCOUNTER — Encounter (HOSPITAL_COMMUNITY): Payer: 59

## 2022-07-12 ENCOUNTER — Other Ambulatory Visit (HOSPITAL_COMMUNITY): Payer: Self-pay

## 2022-07-16 ENCOUNTER — Ambulatory Visit: Payer: 59 | Admitting: Neurology

## 2022-07-18 ENCOUNTER — Encounter: Payer: 59 | Admitting: Nurse Practitioner

## 2022-07-18 ENCOUNTER — Telehealth: Payer: 59 | Admitting: Nurse Practitioner

## 2022-07-18 DIAGNOSIS — J40 Bronchitis, not specified as acute or chronic: Secondary | ICD-10-CM | POA: Diagnosis not present

## 2022-07-18 MED ORDER — AMOXICILLIN 500 MG PO CAPS: 500.0000 mg | ORAL_CAPSULE | Freq: Two times a day (BID) | ORAL | 0 refills | Status: DC

## 2022-07-18 MED ORDER — AZITHROMYCIN 250 MG PO TABS: ORAL_TABLET | ORAL | 0 refills | Status: AC

## 2022-07-18 MED ORDER — PROMETHAZINE-DM 6.25-15 MG/5ML PO SYRP: 5.0000 mL | ORAL_SOLUTION | Freq: Four times a day (QID) | ORAL | 0 refills | Status: DC | PRN

## 2022-07-18 NOTE — Progress Notes (Signed)

## 2022-07-18 NOTE — Addendum Note (Signed)
Addended by: Geryl Rankins on: 07/18/2022 02:06 PM   Modules accepted: Orders

## 2022-07-18 NOTE — Progress Notes (Signed)
E-VISIT already completed

## 2022-07-18 NOTE — Progress Notes (Signed)
I have spent 5 minutes in review of e-visit questionnaire, review and updating patient chart, medical decision making and response to patient.  ° °Masiya Claassen W Ronelle Michie, NP ° °  °

## 2022-07-21 ENCOUNTER — Encounter (INDEPENDENT_AMBULATORY_CARE_PROVIDER_SITE_OTHER): Payer: Self-pay

## 2022-07-23 DIAGNOSIS — L719 Rosacea, unspecified: Secondary | ICD-10-CM | POA: Diagnosis not present

## 2022-07-23 DIAGNOSIS — L910 Hypertrophic scar: Secondary | ICD-10-CM | POA: Diagnosis not present
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
## 2022-08-06 ENCOUNTER — Other Ambulatory Visit (HOSPITAL_COMMUNITY): Payer: Self-pay

## 2022-08-06 ENCOUNTER — Ambulatory Visit (INDEPENDENT_AMBULATORY_CARE_PROVIDER_SITE_OTHER): Payer: 59 | Admitting: Neurology

## 2022-08-06 ENCOUNTER — Encounter: Payer: Self-pay | Admitting: Neurology

## 2022-08-06 VITALS — BP 128/88 | HR 74 | Ht 65.0 in | Wt 203.0 lb

## 2022-08-06 DIAGNOSIS — G43009 Migraine without aura, not intractable, without status migrainosus: Secondary | ICD-10-CM

## 2022-08-06 DIAGNOSIS — M7918 Myalgia, other site: Secondary | ICD-10-CM | POA: Diagnosis not present

## 2022-08-06 DIAGNOSIS — G43809 Other migraine, not intractable, without status migrainosus: Secondary | ICD-10-CM | POA: Diagnosis not present

## 2022-08-06 MED ORDER — ONDANSETRON HCL 4 MG PO TABS
4.0000 mg | ORAL_TABLET | Freq: Three times a day (TID) | ORAL | 5 refills | Status: DC | PRN
  Filled 2022-08-06: qty 20, 7d supply, fill #0

## 2022-08-06 MED ORDER — CYCLOBENZAPRINE HCL 10 MG PO TABS
10.0000 mg | ORAL_TABLET | Freq: Three times a day (TID) | ORAL | 3 refills | Status: DC | PRN
  Filled 2022-08-06: qty 90, 30d supply, fill #0

## 2022-08-06 MED ORDER — SUMATRIPTAN SUCCINATE 25 MG PO TABS
ORAL_TABLET | ORAL | 5 refills | Status: DC
  Filled 2022-08-06: qty 10, 20d supply, fill #0

## 2022-08-06 NOTE — Patient Instructions (Signed)
Refer to physical therapy for neck pain When you get a migraine, try taking sumatriptan '25mg'$  at earliest onset and may repeat after 2 hours if needed.  Alternatively, instead you may try Ubrelvy - may repeat after 2 hours (maximum 2 in 24 hours) With neck pain, may take a cyclobenzaprine but caution for drowsiness If nauseous, take ondansetron Have FMLA form sent to me Follow up 4 months.

## 2022-08-13 ENCOUNTER — Ambulatory Visit: Payer: 59

## 2022-08-15 ENCOUNTER — Encounter: Payer: Self-pay | Admitting: Neurology

## 2022-08-17 ENCOUNTER — Other Ambulatory Visit (HOSPITAL_COMMUNITY): Payer: Self-pay

## 2022-08-17 ENCOUNTER — Other Ambulatory Visit: Payer: Self-pay | Admitting: Neurology

## 2022-08-17 MED ORDER — RIZATRIPTAN BENZOATE 10 MG PO TBDP
10.0000 mg | ORAL_TABLET | ORAL | 5 refills | Status: DC | PRN
  Filled 2022-08-17: qty 10, 30d supply, fill #0

## 2022-08-20 ENCOUNTER — Other Ambulatory Visit (HOSPITAL_COMMUNITY): Payer: Self-pay

## 2022-08-24 ENCOUNTER — Ambulatory Visit: Payer: 59 | Attending: Family Medicine

## 2022-08-24 ENCOUNTER — Other Ambulatory Visit: Payer: Self-pay

## 2022-08-24 DIAGNOSIS — M6281 Muscle weakness (generalized): Secondary | ICD-10-CM | POA: Insufficient documentation

## 2022-08-24 DIAGNOSIS — M542 Cervicalgia: Secondary | ICD-10-CM | POA: Insufficient documentation

## 2022-08-24 DIAGNOSIS — G43809 Other migraine, not intractable, without status migrainosus: Secondary | ICD-10-CM | POA: Diagnosis not present

## 2022-08-24 DIAGNOSIS — R252 Cramp and spasm: Secondary | ICD-10-CM | POA: Insufficient documentation

## 2022-08-24 DIAGNOSIS — R293 Abnormal posture: Secondary | ICD-10-CM | POA: Diagnosis not present

## 2022-08-24 DIAGNOSIS — G4486 Cervicogenic headache: Secondary | ICD-10-CM | POA: Insufficient documentation

## 2022-08-24 DIAGNOSIS — G44209 Tension-type headache, unspecified, not intractable: Secondary | ICD-10-CM | POA: Insufficient documentation
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
## 2022-09-03 ENCOUNTER — Ambulatory Visit: Payer: 59

## 2022-09-03 DIAGNOSIS — G44209 Tension-type headache, unspecified, not intractable: Secondary | ICD-10-CM | POA: Diagnosis not present

## 2022-09-03 DIAGNOSIS — R293 Abnormal posture: Secondary | ICD-10-CM

## 2022-09-03 DIAGNOSIS — R252 Cramp and spasm: Secondary | ICD-10-CM

## 2022-09-03 DIAGNOSIS — G4486 Cervicogenic headache: Secondary | ICD-10-CM

## 2022-09-03 DIAGNOSIS — G43809 Other migraine, not intractable, without status migrainosus: Secondary | ICD-10-CM | POA: Diagnosis not present

## 2022-09-03 DIAGNOSIS — M542 Cervicalgia: Secondary | ICD-10-CM | POA: Diagnosis not present

## 2022-09-03 DIAGNOSIS — M6281 Muscle weakness (generalized): Secondary | ICD-10-CM | POA: Diagnosis not present
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
## 2022-09-08 ENCOUNTER — Ambulatory Visit: Payer: 59 | Admitting: Physical Therapy

## 2022-09-08 ENCOUNTER — Encounter: Payer: Self-pay | Admitting: Physical Therapy

## 2022-09-08 DIAGNOSIS — M6281 Muscle weakness (generalized): Secondary | ICD-10-CM | POA: Diagnosis not present

## 2022-09-08 DIAGNOSIS — G4486 Cervicogenic headache: Secondary | ICD-10-CM

## 2022-09-08 DIAGNOSIS — G44209 Tension-type headache, unspecified, not intractable: Secondary | ICD-10-CM | POA: Diagnosis not present

## 2022-09-08 DIAGNOSIS — R293 Abnormal posture: Secondary | ICD-10-CM | POA: Diagnosis not present

## 2022-09-08 DIAGNOSIS — M542 Cervicalgia: Secondary | ICD-10-CM | POA: Diagnosis not present

## 2022-09-08 DIAGNOSIS — R252 Cramp and spasm: Secondary | ICD-10-CM | POA: Diagnosis not present

## 2022-09-08 DIAGNOSIS — G43809 Other migraine, not intractable, without status migrainosus: Secondary | ICD-10-CM | POA: Diagnosis not present
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
## 2022-10-05 ENCOUNTER — Other Ambulatory Visit (HOSPITAL_COMMUNITY): Payer: Self-pay

## 2022-10-05 MED ORDER — FLUVOXAMINE MALEATE 100 MG PO TABS
200.0000 mg | ORAL_TABLET | Freq: Every day | ORAL | 0 refills | Status: DC
  Filled 2022-10-05: qty 180, 90d supply, fill #0

## 2022-10-05 MED ORDER — FLUVOXAMINE MALEATE 50 MG PO TABS
50.0000 mg | ORAL_TABLET | Freq: Every day | ORAL | 0 refills | Status: DC
  Filled 2022-10-05: qty 90, 90d supply, fill #0

## 2022-10-05 MED ORDER — HYDROXYZINE HCL 25 MG PO TABS
12.5000 mg | ORAL_TABLET | Freq: Two times a day (BID) | ORAL | 0 refills | Status: DC | PRN
  Filled 2022-10-05: qty 60, 30d supply, fill #0

## 2022-10-14 ENCOUNTER — Other Ambulatory Visit (HOSPITAL_COMMUNITY): Payer: Self-pay

## 2022-10-14 DIAGNOSIS — F411 Generalized anxiety disorder: Secondary | ICD-10-CM | POA: Diagnosis not present

## 2022-10-14 DIAGNOSIS — F429 Obsessive-compulsive disorder, unspecified: Secondary | ICD-10-CM | POA: Diagnosis not present

## 2022-10-14 MED ORDER — FLUVOXAMINE MALEATE 100 MG PO TABS
200.0000 mg | ORAL_TABLET | Freq: Every day | ORAL | 1 refills | Status: DC
  Filled 2022-10-14: qty 180, 90d supply, fill #0

## 2022-10-14 MED ORDER — FLUVOXAMINE MALEATE 100 MG PO TABS: 200.0000 mg | ORAL_TABLET | Freq: Every day | ORAL | 0 refills | Status: DC

## 2022-10-14 MED ORDER — ALPRAZOLAM 0.5 MG PO TABS
0.5000 mg | ORAL_TABLET | ORAL | 5 refills | Status: DC | PRN
  Filled 2022-10-14: qty 10, 30d supply, fill #0

## 2022-10-22 ENCOUNTER — Other Ambulatory Visit (HOSPITAL_COMMUNITY): Payer: Self-pay

## 2022-11-10 ENCOUNTER — Other Ambulatory Visit (HOSPITAL_COMMUNITY): Payer: Self-pay

## 2022-11-19 ENCOUNTER — Other Ambulatory Visit: Payer: Self-pay | Admitting: Internal Medicine

## 2022-11-19 ENCOUNTER — Other Ambulatory Visit (HOSPITAL_COMMUNITY): Payer: Self-pay

## 2022-11-19 MED ORDER — LEVOTHYROXINE SODIUM 25 MCG PO TABS
25.0000 ug | ORAL_TABLET | Freq: Every day | ORAL | 0 refills | Status: DC
  Filled 2022-11-19: qty 90, 90d supply, fill #0

## 2022-11-24 ENCOUNTER — Ambulatory Visit: Payer: 59 | Admitting: Internal Medicine
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
## 2022-12-01 ENCOUNTER — Other Ambulatory Visit: Payer: Self-pay | Admitting: Vascular Surgery

## 2022-12-01 DIAGNOSIS — I781 Nevus, non-neoplastic: Secondary | ICD-10-CM
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
## 2022-12-28 ENCOUNTER — Ambulatory Visit: Payer: 59 | Admitting: Neurology

## 2023-01-17 ENCOUNTER — Other Ambulatory Visit (HOSPITAL_COMMUNITY): Payer: Self-pay

## 2023-01-17 MED ORDER — FLUVOXAMINE MALEATE 100 MG PO TABS
200.0000 mg | ORAL_TABLET | Freq: Every day | ORAL | 0 refills | Status: DC
  Filled 2023-01-17: qty 180, 90d supply, fill #0

## 2023-01-17 MED ORDER — HYDROXYZINE HCL 25 MG PO TABS
12.5000 mg | ORAL_TABLET | Freq: Two times a day (BID) | ORAL | 0 refills | Status: DC | PRN
  Filled 2023-01-17: qty 60, 30d supply, fill #0

## 2023-02-01 ENCOUNTER — Other Ambulatory Visit (HOSPITAL_COMMUNITY): Payer: Self-pay

## 2023-02-01 DIAGNOSIS — F411 Generalized anxiety disorder: Secondary | ICD-10-CM | POA: Diagnosis not present

## 2023-02-01 DIAGNOSIS — F429 Obsessive-compulsive disorder, unspecified: Secondary | ICD-10-CM | POA: Diagnosis not present

## 2023-02-01 MED ORDER — FLUVOXAMINE MALEATE 50 MG PO TABS
50.0000 mg | ORAL_TABLET | Freq: Every day | ORAL | 1 refills | Status: DC
  Filled 2023-02-01: qty 90, 90d supply, fill #0

## 2023-02-01 MED ORDER — HYDROXYZINE HCL 25 MG PO TABS
12.5000 mg | ORAL_TABLET | Freq: Two times a day (BID) | ORAL | 1 refills | Status: AC | PRN
  Filled 2023-02-01: qty 60, 30d supply, fill #0

## 2023-02-01 MED ORDER — FLUVOXAMINE MALEATE 100 MG PO TABS
200.0000 mg | ORAL_TABLET | Freq: Every day | ORAL | 1 refills | Status: DC
  Filled 2023-02-01 – 2023-04-26 (×2): qty 180, 90d supply, fill #0

## 2023-02-01 MED ORDER — ALPRAZOLAM 0.5 MG PO TABS
0.5000 mg | ORAL_TABLET | ORAL | 0 refills | Status: DC | PRN
  Filled 2023-02-01 – 2023-04-26 (×2): qty 10, 30d supply, fill #0

## 2023-02-02 ENCOUNTER — Other Ambulatory Visit (HOSPITAL_COMMUNITY): Payer: Self-pay

## 2023-02-22 ENCOUNTER — Other Ambulatory Visit (HOSPITAL_COMMUNITY): Payer: Self-pay

## 2023-04-05 DIAGNOSIS — Z719 Counseling, unspecified: Secondary | ICD-10-CM | POA: Diagnosis not present

## 2023-04-12 ENCOUNTER — Other Ambulatory Visit (HOSPITAL_COMMUNITY): Payer: Self-pay

## 2023-04-12 DIAGNOSIS — B029 Zoster without complications: Secondary | ICD-10-CM | POA: Diagnosis not present

## 2023-04-12 MED ORDER — VALACYCLOVIR HCL 1 G PO TABS
1000.0000 mg | ORAL_TABLET | Freq: Three times a day (TID) | ORAL | 0 refills | Status: DC
  Filled 2023-04-12: qty 21, 7d supply, fill #0

## 2023-04-26 ENCOUNTER — Other Ambulatory Visit (HOSPITAL_COMMUNITY): Payer: Self-pay

## 2023-04-29 ENCOUNTER — Other Ambulatory Visit (HOSPITAL_COMMUNITY): Payer: Self-pay

## 2023-05-10 ENCOUNTER — Other Ambulatory Visit: Payer: Self-pay

## 2023-05-10 ENCOUNTER — Telehealth: Payer: 59 | Admitting: Physician Assistant

## 2023-05-10 DIAGNOSIS — L03113 Cellulitis of right upper limb: Secondary | ICD-10-CM

## 2023-05-10 MED ORDER — DOXYCYCLINE MONOHYDRATE 100 MG PO CAPS
100.0000 mg | ORAL_CAPSULE | Freq: Two times a day (BID) | ORAL | 0 refills | Status: DC
  Filled 2023-05-10: qty 14, 7d supply, fill #0

## 2023-05-10 NOTE — Patient Instructions (Signed)
Sharlet Salina, thank you for joining [name], PA-C for today's virtual visit.  While this provider is not your primary care provider (PCP), if your PCP is located in our provider database this encounter information will be shared with them immediately following your visit.   A [facility] MyChart account gives you access to today's visit and all your visits, tests, and labs performed at [facility] " click here if you don't have a [facility] MyChart account or go to [email or web address]  Consent: (Patient) Alison Kelly provided verbal consent for this virtual visit at the beginning of the encounter.  Current Medications:  Current Outpatient Medications:    ALPRAZolam (XANAX) 0.25 MG tablet, , Disp: , Rfl:    ALPRAZolam (XANAX) 0.5 MG tablet, Take 1 tablet (0.5 mg total) by mouth as needed for anxiety (30 day supply per MD), Disp: 10 tablet, Rfl: 5   ALPRAZolam (XANAX) 0.5 MG tablet, Take 1 tablet (0.5 mg total) by mouth as needed for anxiety. (must last 30 days), Disp: 10 tablet, Rfl: 0   Cholecalciferol (D3 PO), Take by mouth., Disp: , Rfl:    cyclobenzaprine (FLEXERIL) 10 MG tablet, Take 1 tablet (10 mg total) by mouth 3 (three) times daily as needed for muscle spasms., Disp: 90 tablet, Rfl: 3   fluvoxaMINE (LUVOX) 100 MG tablet, Take 2 tablets by mouth every day, Disp: 180 tablet, Rfl: 0   fluvoxaMINE (LUVOX) 100 MG tablet, Take 2 tablets by mouth every day, Disp: 180 tablet, Rfl: 0   fluvoxaMINE (LUVOX) 100 MG tablet, Take 2 tablets (200 mg total) by mouth daily., Disp: 180 tablet, Rfl: 1   fluvoxaMINE (LUVOX) 50 MG tablet, Take 1 tablet (50 mg total) by mouth at bedtime along with 200 mg for a total of 250 mg daily., Disp: 90 tablet, Rfl: 0   fluvoxaMINE (LUVOX) 50 MG tablet, Take 1 tablet (50 mg total) by mouth at bedtime. Take with the 200mg  tablet for a total daily dose of 250mg ., Disp: 90 tablet, Rfl: 1   hydrOXYzine (ATARAX) 25 MG tablet, Take 0.5-1  tablets (12.5-25 mg total) by mouth up to  2 (two) times daily as needed for anxiety, Disp: 60 tablet, Rfl: 0   hydrOXYzine (ATARAX) 25 MG tablet, Take 1/2-1 tablet (12.5-25 mg total) by mouth up to 2 (two) times daily as needed for anxiety., Disp: 60 tablet, Rfl: 1   levothyroxine (SYNTHROID) 25 MCG tablet, Take 1 tablet (25 mcg total) by mouth daily before breakfast., Disp: 90 tablet, Rfl: 0   ondansetron (ZOFRAN) 4 MG tablet, Take 1 tablet (4 mg total) by mouth every 8 (eight) hours as needed for nausea or vomiting., Disp: 20 tablet, Rfl: 5   pyridoxine (B-6) 100 MG tablet, Take 100 mg by mouth daily., Disp: , Rfl:    rizatriptan (MAXALT-MLT) 10 MG disintegrating tablet, Take 1 tablet (10 mg total) by mouth as needed for migraine. May repeat in 2 hours if needed.  Maximum 2 tablets in 24 hours., Disp: 10 tablet, Rfl: 5   valACYclovir (VALTREX) 1000 MG tablet, Take 1 tablet (1,000 mg total) by mouth 3 (three) times daily for 7 days., Disp: 21 tablet, Rfl: 0   Venlafaxine Besylate ER 112.5 MG TB24, , Disp: , Rfl:    vitamin B-12 (CYANOCOBALAMIN) 500 MCG tablet, Take 500 mcg by mouth daily., Disp: , Rfl:    Medications ordered in this encounter:  No orders of the defined types were placed in this encounter.    *If  you need refills on other medications prior to your next appointment, please contact your pharmacy*  Follow-Up: Call back or seek an in-person evaluation if the symptoms worsen or if the condition fails to improve as anticipated.  [facility] [phone]  Other Instructions Cellulitis, Adult  Cellulitis is a skin infection. The infected area is often warm, red, swollen, and sore. It occurs most often on the legs, feet, and toes, but can happen on any part of the body. This condition can be life-threatening without treatment. It is very important to get treated right away. What are the causes? This condition is caused by bacteria. The bacteria enter through a break  in the skin, such as: A cut. A burn. A bug bite. An animal bite. An open sore. A crack. What increases the risk? Having a weak body's defense system (immune system). Being older than 39 years old. Having a blood sugar problem (diabetes). Having a long-term liver disease (cirrhosis) or kidney disease. Being very overweight (obese). Having a skin problem, such as: An itchy rash. A rash caused by a fungus. A rash with blisters. Slow movement of blood in the veins (venous stasis). Fluid buildup below the skin (edema). This condition is more likely to occur in people who: Have open cuts, burns, bites, or scrapes on the skin. Have been treated with high-energy rays (radiation). Use IV drugs. What are the signs or symptoms? Skin that: Looks red or purple, or slightly darker than your usual skin color. Has streaks. Has spots. Is swollen. Is sore or painful when you touch it. Is warm. A fever. Chills. Blisters. Tiredness (fatigue). How is this treated? Medicines to treat infections or allergies. Rest. Placing cold or warm cloths on the skin. Staying in the hospital, if the condition is very bad. You may need medicines through an IV. Follow these instructions at home: Medicines Take over-the-counter and prescription medicines only as told by your doctor. If you were prescribed antibiotics, take them as told by your doctor. Do not stop using them even if you start to feel better. General instructions Drink enough fluid to keep your pee (urine) pale yellow. Do not touch or rub the infected area. Raise (elevate) the infected area above the level of your heart while you are sitting or lying down. Return to your normal activities when your doctor says that it is safe. Place cold or warm cloths on the area as told by your doctor. Keep all follow-up visits. Your doctor will need to make sure that a more serious infection is not developing. Contact a doctor if: You have a  fever. You do not start to get better after 1-2 days of treatment. Your bone or joint under the infected area starts to hurt after the skin has healed. Your infection comes back in the same area or another area. Signs of this may include: You have a swollen bump in the area. Your red area gets larger, turns dark in color, or hurts more. You have more fluid coming from the wound. Pus or a bad smell develops in your infected area. You have more pain. You feel sick and have muscle aches and weakness. You develop vomiting or watery poop that will not go away. Get help right away if: You see red streaks coming from the area. You notice the skin turns purple or black and falls off. These symptoms may be an emergency. Get help right away. Call 911. Do not wait to see if the symptoms will  go away. Do not drive yourself to the hospital. This information is not intended to replace advice given to you by your health care provider. Make sure you discuss any questions you have with your health care provider. Document Revised: 07/27/2022 Document Reviewed: 07/27/2022 Elsevier Patient Education  2024 Elsevier Inc.    If you have been instructed to have an in-person evaluation today at a local Urgent Care facility, please use the link below. It will take you to a list of all of our available [facility], including address, phone number and hours of operation. Please do not delay care.  [facility]  If you or a family member do not have a primary care provider, use the link below to schedule a visit and establish care. When you choose a [facility] primary care physician or advanced practice provider, you gain a long-term partner in health. Find a Primary Care Provider  Learn more about [facility] in-office and virtual care options:  - Get Care Now

## 2023-05-10 NOTE — Progress Notes (Signed)
Virtual Visit Consent   Alison Kelly, you are scheduled for a virtual visit with a [facility] provider today. Just as with appointments in the office, your consent must be obtained to participate. Your consent will be active for this visit and any virtual visit you may have with one of our providers in the next 365 days. If you have a MyChart account, a copy of this consent can be sent to you electronically.  As this is a virtual visit, video technology does not allow for your provider to perform a traditional examination. This may limit your provider's ability to fully assess your condition. If your provider identifies any concerns that need to be evaluated in person or the need to arrange testing (such as labs, EKG, etc.), we will make arrangements to do so. Although advances in technology are sophisticated, we cannot ensure that it will always work on either your end or our end. If the connection with a video visit is poor, the visit may have to be switched to a telephone visit. With either a video or telephone visit, we are not always able to ensure that we have a secure connection.  By engaging in this virtual visit, you consent to the provision of healthcare and authorize for your insurance to be billed (if applicable) for the services provided during this visit. Depending on your insurance coverage, you may receive a charge related to this service.  I need to obtain your verbal consent now. Are you willing to proceed with your visit today? Alison Kelly has provided verbal consent on 05/10/2023 for a virtual visit (video or telephone). Alison Kelly, [place]  Date: 05/10/2023 12:58 PM  Virtual Visit via Video Note   I, Alison Climes, PA-C, attempted to connect with Alison Kelly; MRN [id number] on 05/10/23 via [facility] to complete a video urgent care visit. The patient was unable to successfully connect to the video platform. As such, the patient was contacted by this provider  via phone to complete the encounter.    Location: Patient: Virtual Visit Location Patient: Home Provider: Virtual Visit Location Provider: Home Office   I discussed the limitations of evaluation and management by telemedicine and the availability of in person appointments. The patient expressed understanding and agreed to proceed.    History of Present Illness: Alison Kelly is a 37 y.o. who identifies as a female who was assigned female at birth, and is being seen today for insect bite to the arm first noted a few days ago. Notes initially no issue but now with increasing redness, swelling and now itch, discomfort and tenderness. Denis fever, chills, aches.   HPI: HPI  Problems:  Patient Active Problem List   Diagnosis Date Noted   Vitamin D deficiency 05/24/2022   Skin cancer 05/24/2022   Joint pain 05/24/2022   Hypothyroidism 05/24/2022   Gluten intolerance 05/24/2022   Food allergy 05/24/2022   Depression 05/24/2022   Constipation 05/24/2022   Anorexia 05/24/2022   BMI 31.0-31.9,adult    Hashimoto's disease    Dysfunctional uterine bleeding 09/18/2019   Cesarean delivery delivered 07/23/2016   Hypothyroidism due to Hashimoto's thyroiditis 03/02/2016   Panic attacks 08/19/2013   Status post primary low transverse cesarean section 08/18/2013   Anemia 08/17/2013   OCD (obsessive compulsive disorder) 07/04/2013   Anxiety state, unspecified 07/04/2013   Fibromyalgia 07/04/2013   Hx of anorexia nervosa and bulimia 07/04/2013   Rheumatoid arthritis (HCC) 07/04/2013   Allergy to multiple drugs 07/04/2013    Allergies:  Allergies  Allergen Reactions   Pineapple Swelling    Lips/tongue swelling, doesn't not bother breathing   Codeine Hives   Darvocet [Propoxyphene N-Acetaminophen] Hives   Gluten Meal    Hydrocodone Hives   Elemental Sulfur Rash   Methotrexate Derivatives Hives and Rash   Prednisone Hives and Rash   Medications:  Current Outpatient Medications:     ALPRAZolam (XANAX) 0.5 MG tablet, Take 1 tablet (0.5 mg total) by mouth as needed for anxiety. (must last 30 days), Disp: 10 tablet, Rfl: 0   fluvoxaMINE (LUVOX) 100 MG tablet, Take 2 tablets by mouth every day, Disp: 180 tablet, Rfl: 0   fluvoxaMINE (LUVOX) 50 MG tablet, Take 1 tablet (50 mg total) by mouth at bedtime along with 200 mg for a total of 250 mg daily., Disp: 90 tablet, Rfl: 0   fluvoxaMINE (LUVOX) 50 MG tablet, Take 1 tablet (50 mg total) by mouth at bedtime. Take with the 200mg  tablet for a total daily dose of 250mg ., Disp: 90 tablet, Rfl: 1   hydrOXYzine (ATARAX) 25 MG tablet, Take 0.5-1 tablets (12.5-25 mg total) by mouth up to  2 (two) times daily as needed for anxiety, Disp: 60 tablet, Rfl: 0   hydrOXYzine (ATARAX) 25 MG tablet, Take 1/2-1 tablet (12.5-25 mg total) by mouth up to 2 (two) times daily as needed for anxiety., Disp: 60 tablet, Rfl: 1   pyridoxine (B-6) 100 MG tablet, Take 100 mg by mouth daily., Disp: , Rfl:    vitamin B-12 (CYANOCOBALAMIN) 500 MCG tablet, Take 500 mcg by mouth daily., Disp: , Rfl:   Observations/Objective: Patient is well-developed, well-nourished in no acute distress.  Resting comfortably at home.  Head is normocephalic, atraumatic.  No labored breathing. Speech is clear and coherent with logical content.  Patient is alert and oriented at baseline.   Assessment and Plan: 1. Cellulitis of right upper extremity  Start Doxycycline per orders. Skin care discussed. Can use her hydroxyzine to help reduce swelling. Strict in-person follow-up discussed.   Follow Up Instructions: I discussed the assessment and treatment plan with the patient. The patient was provided an opportunity to ask questions and all were answered. The patient agreed with the plan and demonstrated an understanding of the instructions.  A copy of instructions were sent to the patient via MyChart unless otherwise noted below.   The patient was advised to call back or seek an  in-person evaluation if the symptoms worsen or if the condition fails to improve as anticipated.  Time:  I spent 10 minutes with the patient via telehealth technology discussing the above problems/concerns.    Alison Climes, PA-C

## 2023-05-19 ENCOUNTER — Other Ambulatory Visit: Payer: Self-pay

## 2023-05-24 ENCOUNTER — Telehealth: Payer: 59 | Admitting: Nurse Practitioner

## 2023-05-24 ENCOUNTER — Other Ambulatory Visit (HOSPITAL_COMMUNITY): Payer: Self-pay

## 2023-05-24 DIAGNOSIS — B379 Candidiasis, unspecified: Secondary | ICD-10-CM

## 2023-05-24 DIAGNOSIS — T3695XA Adverse effect of unspecified systemic antibiotic, initial encounter: Secondary | ICD-10-CM

## 2023-05-24 DIAGNOSIS — S40262A Insect bite (nonvenomous) of left shoulder, initial encounter: Secondary | ICD-10-CM | POA: Diagnosis not present

## 2023-05-24 DIAGNOSIS — W57XXXA Bitten or stung by nonvenomous insect and other nonvenomous arthropods, initial encounter: Secondary | ICD-10-CM | POA: Diagnosis not present

## 2023-05-24 MED ORDER — FLUCONAZOLE 150 MG PO TABS
150.0000 mg | ORAL_TABLET | ORAL | 0 refills | Status: DC
  Filled 2023-05-24: qty 2, 6d supply, fill #0

## 2023-05-24 MED ORDER — DOXYCYCLINE HYCLATE 100 MG PO TABS
200.0000 mg | ORAL_TABLET | Freq: Once | ORAL | 0 refills | Status: AC
  Filled 2023-05-24: qty 2, 1d supply, fill #0

## 2023-05-24 NOTE — Progress Notes (Signed)
Virtual Visit Consent   Alison Kelly, you are scheduled for a virtual visit with a [facility] provider today. Just as with appointments in the office, your consent must be obtained to participate. Your consent will be active for this visit and any virtual visit you may have with one of our providers in the next 365 days. If you have a MyChart account, a copy of this consent can be sent to you electronically.  As this is a virtual visit, video technology does not allow for your provider to perform a traditional examination. This may limit your provider's ability to fully assess your condition. If your provider identifies any concerns that need to be evaluated in person or the need to arrange testing (such as labs, EKG, etc.), we will make arrangements to do so. Although advances in technology are sophisticated, we cannot ensure that it will always work on either your end or our end. If the connection with a video visit is poor, the visit may have to be switched to a telephone visit. With either a video or telephone visit, we are not always able to ensure that we have a secure connection.  By engaging in this virtual visit, you consent to the provision of healthcare and authorize for your insurance to be billed (if applicable) for the services provided during this visit. Depending on your insurance coverage, you may receive a charge related to this service.  I need to obtain your verbal consent now. Are you willing to proceed with your visit today? Alison Kelly has provided verbal consent on 05/24/2023 for a virtual visit (video or telephone). Claiborne Rigg, NP  Date: 05/24/2023 7:14 PM  Virtual Visit via Video Note   I, Claiborne Rigg, connected with  Alison Kelly  ([id number], 1986-08-21) on 05/24/23 at  7:00 PM EDT by a video-enabled telemedicine application and verified that I am speaking with the correct person using two identifiers.  Location: Patient: Virtual Visit Location Patient:  Home Provider: Virtual Visit Location Provider: Home Office   I discussed the limitations of evaluation and management by telemedicine and the availability of in person appointments. The patient expressed understanding and agreed to proceed.    History of Present Illness: Alison Kelly is a 37 y.o. who identifies as a female who was assigned female at birth, and is being seen today for tick bite.  Ms Heslin states she found a tick on her left shoulder that she just recently removed prior to her video visit. The tick had not been attached for more than a few hours. She would like prophylactic abx treatment for this as she does have an erythematous area of skin now present. Denies fever, malaise, headache  Problems:  Patient Active Problem List   Diagnosis Date Noted   Vitamin D deficiency 05/24/2022   Skin cancer 05/24/2022   Joint pain 05/24/2022   Hypothyroidism 05/24/2022   Gluten intolerance 05/24/2022   Food allergy 05/24/2022   Depression 05/24/2022   Constipation 05/24/2022   Anorexia 05/24/2022   BMI 31.0-31.9,adult    Hashimoto's disease    Dysfunctional uterine bleeding 09/18/2019   Cesarean delivery delivered 07/23/2016   Hypothyroidism due to Hashimoto's thyroiditis 03/02/2016   Panic attacks 08/19/2013   Status post primary low transverse cesarean section 08/18/2013   Anemia 08/17/2013   OCD (obsessive compulsive disorder) 07/04/2013   Anxiety state, unspecified 07/04/2013   Fibromyalgia 07/04/2013   Hx of anorexia nervosa and bulimia 07/04/2013   Rheumatoid arthritis (HCC) 07/04/2013  Allergy to multiple drugs 07/04/2013    Allergies:  Allergies  Allergen Reactions   Pineapple Swelling    Lips/tongue swelling, doesn't not bother breathing   Codeine Hives   Darvocet [Propoxyphene N-Acetaminophen] Hives   Gluten Meal    Hydrocodone Hives   Elemental Sulfur Rash   Methotrexate Derivatives Hives and Rash   Prednisone Hives and Rash   Medications:   Current Outpatient Medications:    doxycycline (VIBRA-TABS) 100 MG tablet, Take 2 tablets (200 mg total) by mouth once for 1 dose., Disp: 2 tablet, Rfl: 0   fluconazole (DIFLUCAN) 150 MG tablet, Take 1 tablet (150 mg total) by mouth every 3 (three) days., Disp: 2 tablet, Rfl: 0   ALPRAZolam (XANAX) 0.5 MG tablet, Take 1 tablet (0.5 mg total) by mouth as needed for anxiety. (must last 30 days), Disp: 10 tablet, Rfl: 0   doxycycline (MONODOX) 100 MG capsule, Take 1 capsule (100 mg total) by mouth 2 (two) times daily., Disp: 14 capsule, Rfl: 0   fluvoxaMINE (LUVOX) 100 MG tablet, Take 2 tablets by mouth every day, Disp: 180 tablet, Rfl: 0   fluvoxaMINE (LUVOX) 50 MG tablet, Take 1 tablet (50 mg total) by mouth at bedtime along with 200 mg for a total of 250 mg daily., Disp: 90 tablet, Rfl: 0   fluvoxaMINE (LUVOX) 50 MG tablet, Take 1 tablet (50 mg total) by mouth at bedtime. Take with the 200mg  tablet for a total daily dose of 250mg ., Disp: 90 tablet, Rfl: 1   hydrOXYzine (ATARAX) 25 MG tablet, Take 0.5-1 tablets (12.5-25 mg total) by mouth up to  2 (two) times daily as needed for anxiety, Disp: 60 tablet, Rfl: 0   hydrOXYzine (ATARAX) 25 MG tablet, Take 1/2-1 tablet (12.5-25 mg total) by mouth up to 2 (two) times daily as needed for anxiety., Disp: 60 tablet, Rfl: 1   pyridoxine (B-6) 100 MG tablet, Take 100 mg by mouth daily., Disp: , Rfl:    vitamin B-12 (CYANOCOBALAMIN) 500 MCG tablet, Take 500 mcg by mouth daily., Disp: , Rfl:   Observations/Objective: Patient is well-developed, well-nourished in no acute distress.  Resting comfortably at home.  Head is normocephalic, atraumatic.  No labored breathing.  Speech is clear and coherent with logical content.  Patient is alert and oriented at baseline.    Assessment and Plan: 1. Tick bite of left shoulder, initial encounter - doxycycline (VIBRA-TABS) 100 MG tablet; Take 2 tablets (200 mg total) by mouth once for 1 dose.  Dispense: 2 tablet;  Refill: 0  2. Antibiotic-induced yeast infection - fluconazole (DIFLUCAN) 150 MG tablet; Take 1 tablet (150 mg total) by mouth every 3 (three) days.  Dispense: 2 tablet; Refill: 0   Follow Up Instructions: I discussed the assessment and treatment plan with the patient. The patient was provided an opportunity to ask questions and all were answered. The patient agreed with the plan and demonstrated an understanding of the instructions.  A copy of instructions were sent to the patient via MyChart unless otherwise noted below.    The patient was advised to call back or seek an in-person evaluation if the symptoms worsen or if the condition fails to improve as anticipated.  Time:  I spent  minutes with the patient via telehealth technology discussing the above problems/concerns.    Claiborne Rigg, NP

## 2023-05-24 NOTE — Patient Instructions (Signed)
Sharlet [facility]ina, thank you for joining Claiborne Rigg, NP for today's virtual visit.  While this provider is not your primary care provider (PCP), if your PCP is located in our provider database this encounter information will be shared with them immediately following your visit.   A [facility] MyChart account gives you access to today's visit and all your visits, tests, and labs performed at [facility] " click here if you don't have a [facility] MyChart account or go to [email or web address]  Consent: (Patient) Alison Kelly provided verbal consent for this virtual visit at the beginning of the encounter.  Current Medications:  Current Outpatient Medications:    doxycycline (VIBRA-TABS) 100 MG tablet, Take 2 tablets (200 mg total) by mouth once for 1 dose., Disp: 2 tablet, Rfl: 0   fluconazole (DIFLUCAN) 150 MG tablet, Take 1 tablet (150 mg total) by mouth every 3 (three) days., Disp: 2 tablet, Rfl: 0   ALPRAZolam (XANAX) 0.5 MG tablet, Take 1 tablet (0.5 mg total) by mouth as needed for anxiety. (must last 30 days), Disp: 10 tablet, Rfl: 0   doxycycline (MONODOX) 100 MG capsule, Take 1 capsule (100 mg total) by mouth 2 (two) times daily., Disp: 14 capsule, Rfl: 0   fluvoxaMINE (LUVOX) 100 MG tablet, Take 2 tablets by mouth every day, Disp: 180 tablet, Rfl: 0   fluvoxaMINE (LUVOX) 50 MG tablet, Take 1 tablet (50 mg total) by mouth at bedtime along with 200 mg for a total of 250 mg daily., Disp: 90 tablet, Rfl: 0   fluvoxaMINE (LUVOX) 50 MG tablet, Take 1 tablet (50 mg total) by mouth at bedtime. Take with the 200mg  tablet for a total daily dose of 250mg ., Disp: 90 tablet, Rfl: 1   hydrOXYzine (ATARAX) 25 MG tablet, Take 0.5-1 tablets (12.5-25 mg total) by mouth up to  2 (two) times daily as needed for anxiety, Disp: 60 tablet, Rfl: 0   hydrOXYzine (ATARAX) 25 MG tablet, Take 1/2-1 tablet (12.5-25 mg total) by mouth up to 2 (two) times daily as needed for anxiety., Disp: 60  tablet, Rfl: 1   pyridoxine (B-6) 100 MG tablet, Take 100 mg by mouth daily., Disp: , Rfl:    vitamin B-12 (CYANOCOBALAMIN) 500 MCG tablet, Take 500 mcg by mouth daily., Disp: , Rfl:    Medications ordered in this encounter:  Meds ordered this encounter  Medications   doxycycline (VIBRA-TABS) 100 MG tablet    Sig: Take 2 tablets (200 mg total) by mouth once for 1 dose.    Dispense:  2 tablet    Refill:  0    Order Specific Question:   Supervising Provider    Answer:   Merrilee Jansky [[id number]]   fluconazole (DIFLUCAN) 150 MG tablet    Sig: Take 1 tablet (150 mg total) by mouth every 3 (three) days.    Dispense:  2 tablet    Refill:  0    Order Specific Question:   Supervising Provider    Answer:   Merrilee Jansky [id number]     *If you need refills on other medications prior to your next appointment, please contact your pharmacy*  Follow-Up: Call back or seek an in-person evaluation if the symptoms worsen or if the condition fails to improve as anticipated.  [facility] [phone]  If you have been instructed to have an in-person evaluation today at a local Urgent Care facility, please use the link below. It will take you to a list  of all of our available [facility], including address, phone number and hours of operation. Please do not delay care.  [facility]  If you or a family member do not have a primary care provider, use the link below to schedule a visit and establish care. When you choose a [facility] primary care physician or advanced practice provider, you gain a long-term partner in health. Find a Primary Care Provider  Learn more about [facility] in-office and virtual care options: [facility]

## 2023-05-25 ENCOUNTER — Other Ambulatory Visit (HOSPITAL_COMMUNITY): Payer: Self-pay

## 2023-06-06 ENCOUNTER — Other Ambulatory Visit (HOSPITAL_COMMUNITY): Payer: Self-pay

## 2023-06-06 IMAGING — CT CT HEAD W/O CM
4 series · 16 of 47 positions shown, 18 images · non-contrast
Comparison: None.

CLINICAL DATA: Dizziness, headache

EXAM:
CT HEAD WITHOUT CONTRAST
TECHNIQUE: Contiguous axial images were obtained from the base of the skull
through the vertex without intravenous contrast.

[Series 3: head without · axial · non-contrast · 0.41mm/px · z∈[-129,-9]mm · 7 of 33 slices shown, 9 images]
[im 5/33  brain]
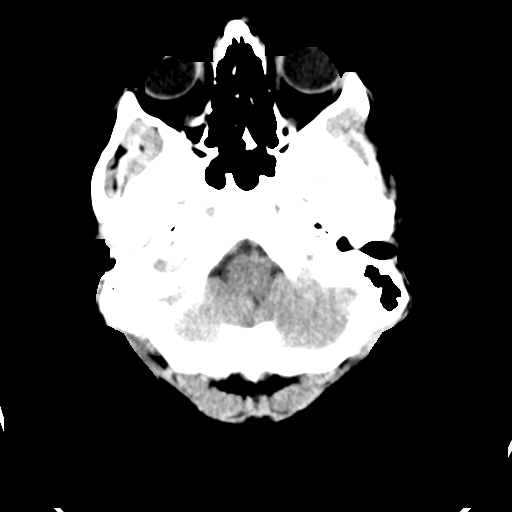
[im 5/33  bone]
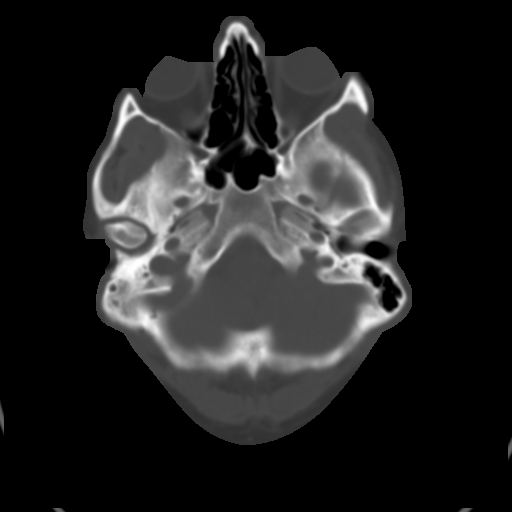
[im 9/33  brain]
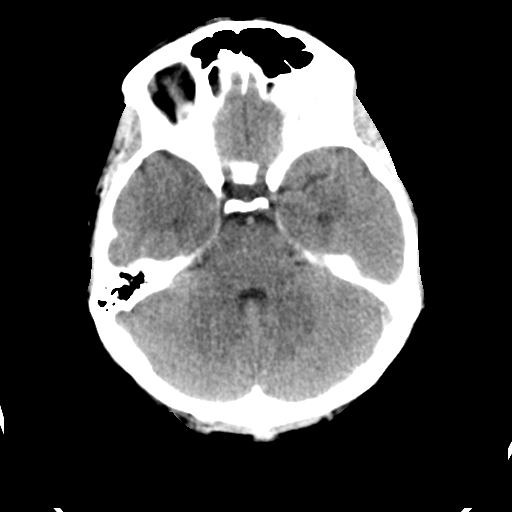
[im 13/33  brain]
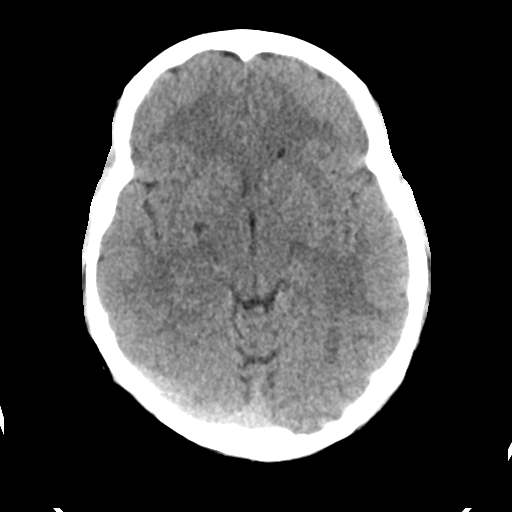
[im 17/33  brain]
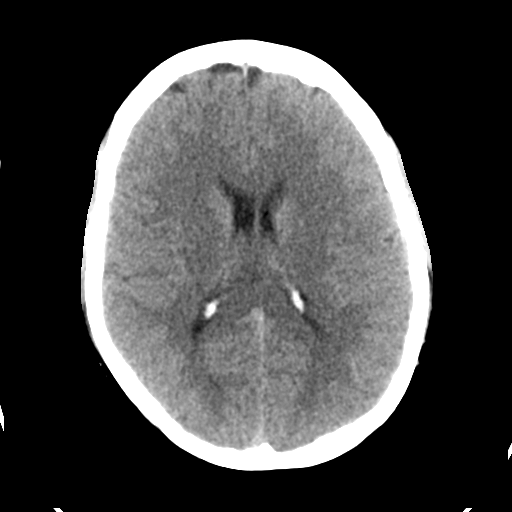
[im 21/33  brain]
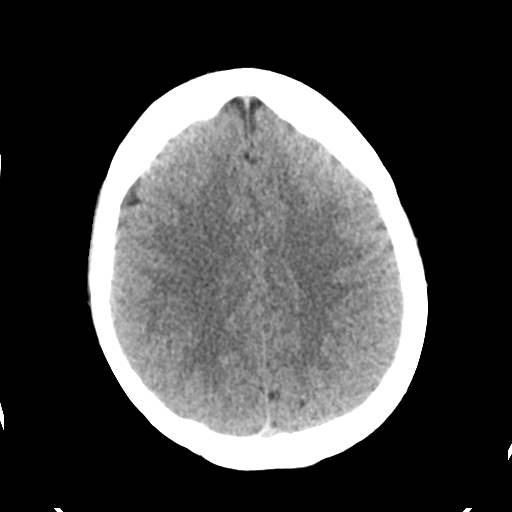
[im 21/33  bone]
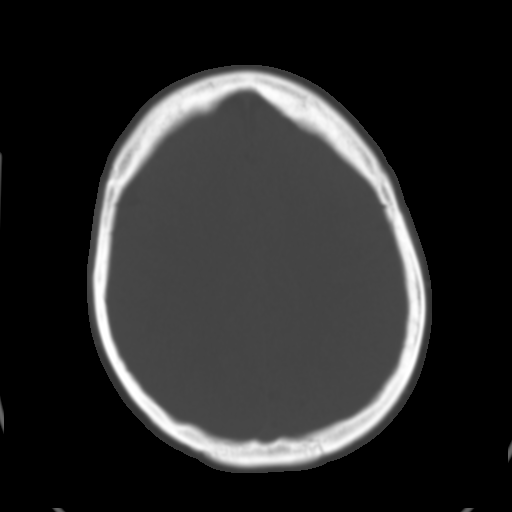
[im 25/33  brain]
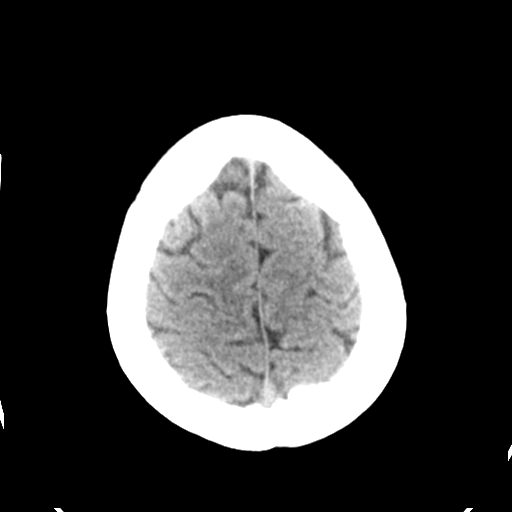
[im 29/33  brain]
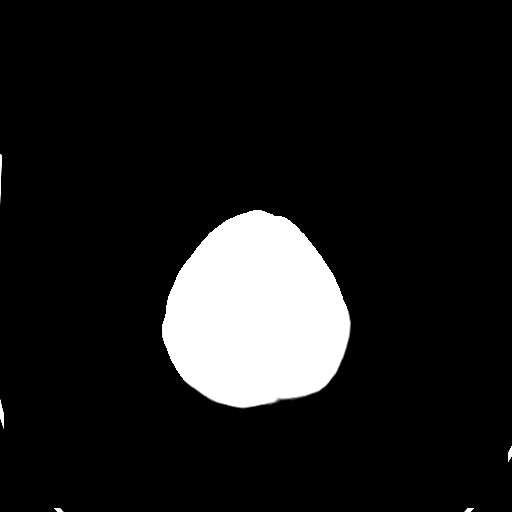

[Series 4: head bone · axial · 0.41mm/px · z∈[-133,-101]mm · 3 of 81 slices shown]
[im 9/81  bone]
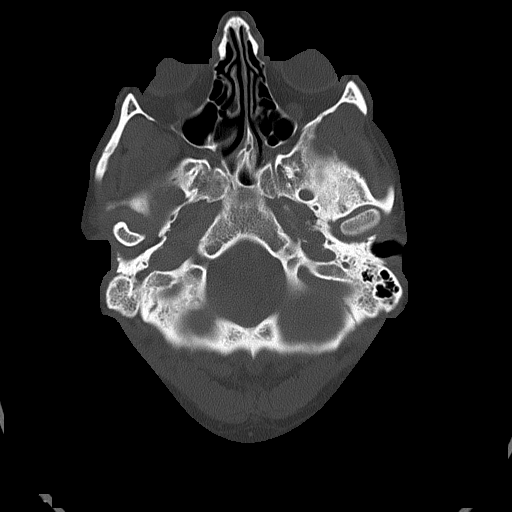
[im 17/81  bone]
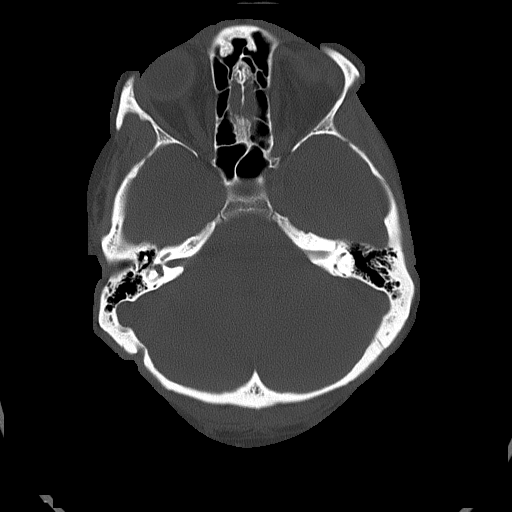
[im 25/81  bone]
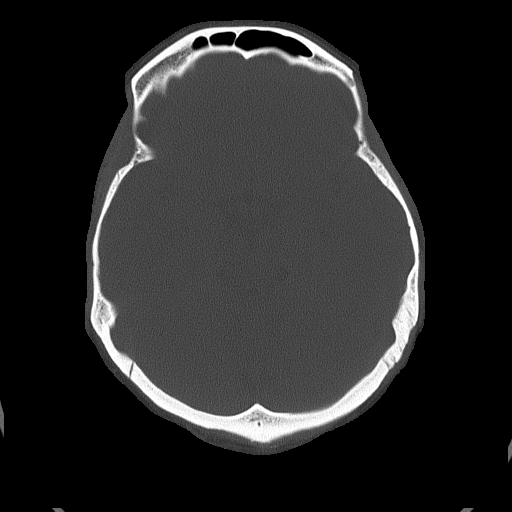

[Series 5: head without cor · coronal · non-contrast · 0.32mm/px · 3 of 67 slices shown]
[im 23/67  brain]
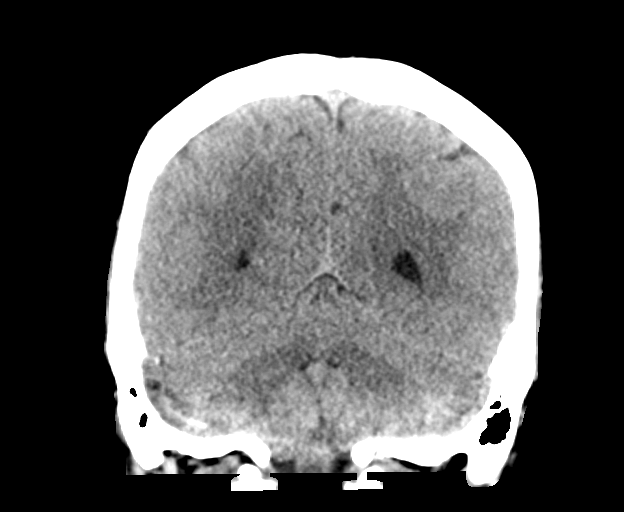
[im 30/67  brain]
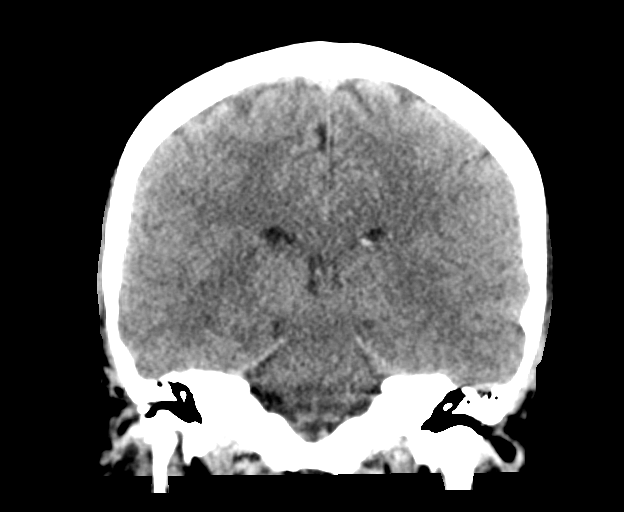
[im 37/67  brain]
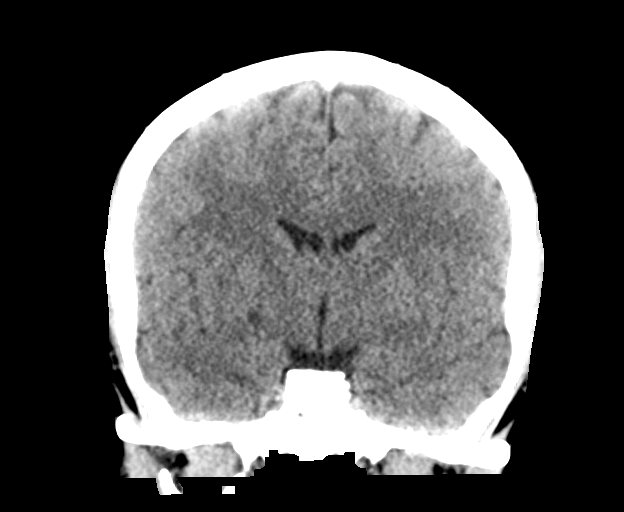

[Series 6: head without sag · sagittal · non-contrast · 0.30mm/px · 3 of 65 slices shown]
[im 22/65  brain]
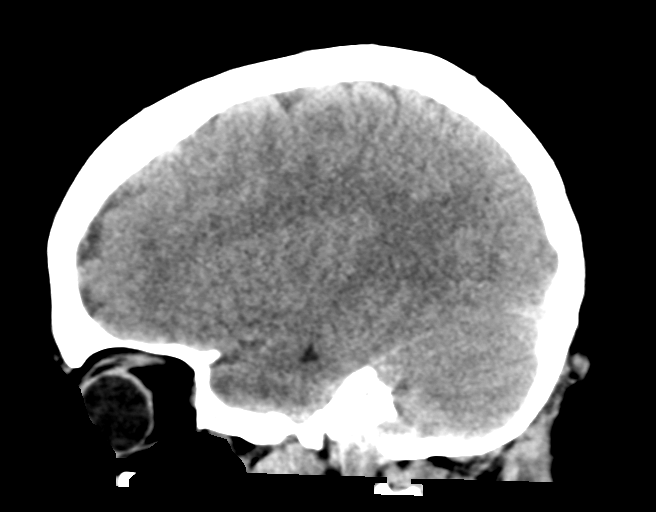
[im 33/65  brain]
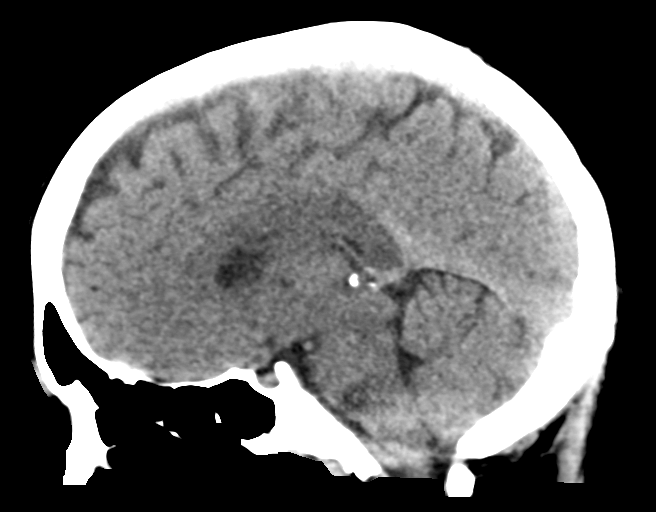
[im 43/65  brain]
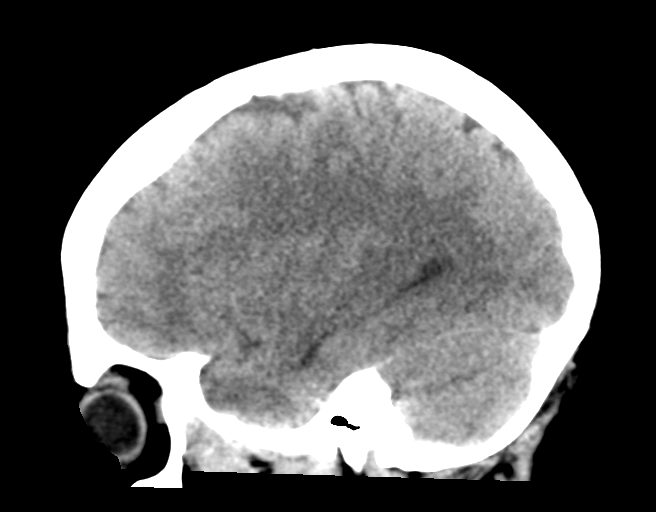

[16 of 47 positions shown; findings below may reference images not displayed]

FINDINGS: Brain: No acute intracranial abnormality. Specifically, no
hemorrhage, hydrocephalus, mass lesion, acute infarction, or
significant intracranial injury.

Vascular: No hyperdense vessel or unexpected calcification.

Skull: No acute calvarial abnormality.

Sinuses/Orbits: No acute findings

Other: None
IMPRESSION: No acute intracranial abnormality.

## 2023-06-27 ENCOUNTER — Telehealth: Payer: 59 | Admitting: Nurse Practitioner

## 2023-06-27 DIAGNOSIS — T3695XA Adverse effect of unspecified systemic antibiotic, initial encounter: Secondary | ICD-10-CM

## 2023-06-27 DIAGNOSIS — B379 Candidiasis, unspecified: Secondary | ICD-10-CM

## 2023-06-27 DIAGNOSIS — R3989 Other symptoms and signs involving the genitourinary system: Secondary | ICD-10-CM | POA: Diagnosis not present

## 2023-06-27 MED ORDER — FLUCONAZOLE 150 MG PO TABS: 150.0000 mg | ORAL_TABLET | Freq: Once | ORAL | 0 refills | Status: AC

## 2023-06-27 MED ORDER — CEPHALEXIN 500 MG PO CAPS: 500.0000 mg | ORAL_CAPSULE | Freq: Two times a day (BID) | ORAL | 0 refills | Status: AC

## 2023-06-27 NOTE — Progress Notes (Signed)
Virtual Visit Consent   Alison Kelly, you are scheduled for a virtual visit with a [facility] provider today. Just as with appointments in the office, your consent must be obtained to participate. Your consent will be active for this visit and any virtual visit you may have with one of our providers in the next 365 days. If you have a MyChart account, a copy of this consent can be sent to you electronically.  As this is a virtual visit, video technology does not allow for your provider to perform a traditional examination. This may limit your provider's ability to fully assess your condition. If your provider identifies any concerns that need to be evaluated in person or the need to arrange testing (such as labs, EKG, etc.), we will make arrangements to do so. Although advances in technology are sophisticated, we cannot ensure that it will always work on either your end or our end. If the connection with a video visit is poor, the visit may have to be switched to a telephone visit. With either a video or telephone visit, we are not always able to ensure that we have a secure connection.  By engaging in this virtual visit, you consent to the provision of healthcare and authorize for your insurance to be billed (if applicable) for the services provided during this visit. Depending on your insurance coverage, you may receive a charge related to this service.  I need to obtain your verbal consent now. Are you willing to proceed with your visit today? Alison Kelly has provided verbal consent on 06/27/2023 for a virtual visit (video or telephone). Viviano Simas, FNP  Date: 06/27/2023 6:06 PM  Virtual Visit via Video Note   I, Viviano Simas, connected with  Alison Kelly  ([id number], May 28, 1986) (age 37) on 06/27/23 at  6:15 PM EDT by a video-enabled telemedicine application and verified that I am speaking with the correct person using two identifiers.  Location: Patient: Virtual Visit Location Patient:  Home Provider: Virtual Visit Location Provider: Home Office   I discussed the limitations of evaluation and management by telemedicine and the availability of in person appointments. The patient expressed understanding and agreed to proceed.    History of Present Illness: Alison Kelly is a 37 y.o. who identifies as a female who was assigned female at birth, and is being seen today for urinary frequency and urgency and burning with urination   Symptom onset was today   Has not had a UTI in the past few years   No fever  No back pain  No nausea   Denies chance of pregnancy   Problems:  Patient Active Problem List   Diagnosis Date Noted   Vitamin D deficiency 05/24/2022   Skin cancer 05/24/2022   Joint pain 05/24/2022   Hypothyroidism 05/24/2022   Gluten intolerance 05/24/2022   Food allergy 05/24/2022   Depression 05/24/2022   Constipation 05/24/2022   Anorexia 05/24/2022   BMI 31.0-31.9,adult    Hashimoto's disease    Dysfunctional uterine bleeding 09/18/2019   Cesarean delivery delivered 07/23/2016   Hypothyroidism due to Hashimoto's thyroiditis 03/02/2016   Panic attacks 08/19/2013   Status post primary low transverse cesarean section 08/18/2013   Anemia 08/17/2013   OCD (obsessive compulsive disorder) 07/04/2013   Anxiety state, unspecified 07/04/2013   Fibromyalgia 07/04/2013   Hx of anorexia nervosa and bulimia 07/04/2013   Rheumatoid arthritis (HCC) 07/04/2013   Allergy to multiple drugs 07/04/2013    Allergies:  Allergies  Allergen Reactions   Pineapple  Swelling    Lips/tongue swelling, doesn't not bother breathing   Codeine Hives   Darvocet [Propoxyphene N-Acetaminophen] Hives   Gluten Meal    Hydrocodone Hives   Elemental Sulfur Rash   Methotrexate Derivatives Hives and Rash   Prednisone Hives and Rash   Medications:  Current Outpatient Medications:    ALPRAZolam (XANAX) 0.5 MG tablet, Take 1 tablet (0.5 mg total) by mouth as needed for anxiety.  (must last 30 days), Disp: 10 tablet, Rfl: 0   doxycycline (MONODOX) 100 MG capsule, Take 1 capsule (100 mg total) by mouth 2 (two) times daily., Disp: 14 capsule, Rfl: 0   fluconazole (DIFLUCAN) 150 MG tablet, Take 1 tablet (150 mg total) by mouth every 3 (three) days., Disp: 2 tablet, Rfl: 0   fluvoxaMINE (LUVOX) 100 MG tablet, Take 2 tablets by mouth every day, Disp: 180 tablet, Rfl: 0   fluvoxaMINE (LUVOX) 50 MG tablet, Take 1 tablet (50 mg total) by mouth at bedtime along with 200 mg for a total of 250 mg daily., Disp: 90 tablet, Rfl: 0   fluvoxaMINE (LUVOX) 50 MG tablet, Take 1 tablet (50 mg total) by mouth at bedtime. Take with the 200mg  tablet for a total daily dose of 250mg ., Disp: 90 tablet, Rfl: 1   hydrOXYzine (ATARAX) 25 MG tablet, Take 0.5-1 tablets (12.5-25 mg total) by mouth up to  2 (two) times daily as needed for anxiety, Disp: 60 tablet, Rfl: 0   hydrOXYzine (ATARAX) 25 MG tablet, Take 1/2-1 tablet (12.5-25 mg total) by mouth up to 2 (two) times daily as needed for anxiety., Disp: 60 tablet, Rfl: 1   pyridoxine (B-6) 100 MG tablet, Take 100 mg by mouth daily., Disp: , Rfl:    vitamin B-12 (CYANOCOBALAMIN) 500 MCG tablet, Take 500 mcg by mouth daily., Disp: , Rfl:   Observations/Objective: Patient is well-developed, well-nourished in no acute distress.  Resting comfortably  at home.  Head is normocephalic, atraumatic.  No labored breathing.  Speech is clear and coherent with logical content.  Patient is alert and oriented at baseline.    Assessment and Plan: 1. Suspected UTI  - cephALEXin (KEFLEX) 500 MG capsule; Take 1 capsule (500 mg total) by mouth 2 (two) times daily for 7 days.  Dispense: 14 capsule; Refill: 0  2. Antibiotic-induced yeast infection  - fluconazole (DIFLUCAN) 150 MG tablet; Take 1 tablet (150 mg total) by mouth once for 1 dose.  Dispense: 1 tablet; Refill: 0     Follow Up Instructions: I discussed the assessment and treatment plan with the patient.  The patient was provided an opportunity to ask questions and all were answered. The patient agreed with the plan and demonstrated an understanding of the instructions.  A copy of instructions were sent to the patient via MyChart unless otherwise noted below.    The patient was advised to call back or seek an in-person evaluation if the symptoms worsen or if the condition fails to improve as anticipated.  Time:  I spent 14 minutes with the patient via telehealth technology discussing the above problems/concerns.    Viviano Simas, FNP

## 2023-08-01 ENCOUNTER — Other Ambulatory Visit (HOSPITAL_COMMUNITY): Payer: Self-pay

## 2023-08-01 DIAGNOSIS — F411 Generalized anxiety disorder: Secondary | ICD-10-CM | POA: Diagnosis not present

## 2023-08-01 DIAGNOSIS — F429 Obsessive-compulsive disorder, unspecified: Secondary | ICD-10-CM | POA: Diagnosis not present

## 2023-08-01 MED ORDER — FLUVOXAMINE MALEATE 100 MG PO TABS
200.0000 mg | ORAL_TABLET | Freq: Every day | ORAL | 1 refills | Status: DC
  Filled 2023-08-01 – 2023-08-22 (×2): qty 180, 90d supply, fill #0

## 2023-08-11 ENCOUNTER — Other Ambulatory Visit (HOSPITAL_COMMUNITY): Payer: Self-pay

## 2023-08-22 ENCOUNTER — Other Ambulatory Visit (HOSPITAL_COMMUNITY): Payer: Self-pay

## 2023-10-05 DIAGNOSIS — N939 Abnormal uterine and vaginal bleeding, unspecified: Secondary | ICD-10-CM | POA: Diagnosis not present

## 2023-10-05 DIAGNOSIS — N644 Mastodynia: Secondary | ICD-10-CM | POA: Diagnosis not present

## 2023-10-20 DIAGNOSIS — N644 Mastodynia: Secondary | ICD-10-CM | POA: Diagnosis not present

## 2023-10-21 DIAGNOSIS — N644 Mastodynia: Secondary | ICD-10-CM | POA: Diagnosis not present

## 2023-11-14 ENCOUNTER — Other Ambulatory Visit (HOSPITAL_COMMUNITY): Payer: Self-pay

## 2023-11-29 DIAGNOSIS — N920 Excessive and frequent menstruation with regular cycle: Secondary | ICD-10-CM | POA: Diagnosis not present

## 2023-11-30 DIAGNOSIS — N920 Excessive and frequent menstruation with regular cycle: Secondary | ICD-10-CM | POA: Diagnosis not present

## 2023-12-12 ENCOUNTER — Encounter: Payer: Self-pay | Admitting: Family

## 2023-12-12 NOTE — Patient Instructions (Signed)
 Welcome to [facility], It was a pleasure meeting you today!   I will review your lab results via MyChart in a few days.  Happy New Year!    PLEASE NOTE: If you had any LAB tests please let us  know if you have not heard back within a few days. You may see your results on MyChart before we have a chance to review them but we will give you a call once they are reviewed by us . If we ordered any REFERRALS today, please let us  know if you have not heard from their office within the next week.  Let us  know through MyChart if you are needing REFILLS, or have your pharmacy send us  the request. You can also use MyChart to communicate with me or any office staff.  Please try these tips to maintain a healthy lifestyle: It is important that you exercise regularly at least 30 minutes 5 times a week. Think about what you will eat, plan ahead. Choose whole foods, & think  clean, green, fresh or frozen over canned, processed or packaged foods which are more sugary, salty, and fatty. 70 to 75% of food eaten should be fresh vegetables and protein. 2-3  meals daily with healthy snacks between meals, but must be whole fruit, protein or vegetables. Aim to eat over a 10 hour period when you are active, for example, 7am to 5pm, and then STOP after your last meal of the day, drinking only water.  Shorter eating windows, 6-8 hours, are showing benefits in heart disease and blood sugar regulation. Drink water every day! Shoot for 64 ounces daily = 8 cups, no other drink is as healthy! Fruit juice is best enjoyed in a healthy way, by EATING the fruit.
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
## 2023-12-13 ENCOUNTER — Ambulatory Visit: Payer: 59 | Admitting: Family

## 2023-12-13 ENCOUNTER — Encounter: Payer: Self-pay | Admitting: Family

## 2023-12-13 VITALS — BP 129/83 | HR 72 | Temp 97.3°F | Ht 65.0 in | Wt 202.5 lb

## 2023-12-13 DIAGNOSIS — N920 Excessive and frequent menstruation with regular cycle: Secondary | ICD-10-CM | POA: Diagnosis not present

## 2023-12-13 DIAGNOSIS — Z1159 Encounter for screening for other viral diseases: Secondary | ICD-10-CM | POA: Diagnosis not present

## 2023-12-13 DIAGNOSIS — Z0000 Encounter for general adult medical examination without abnormal findings: Secondary | ICD-10-CM | POA: Diagnosis not present

## 2023-12-13 DIAGNOSIS — K5900 Constipation, unspecified: Secondary | ICD-10-CM | POA: Diagnosis not present

## 2023-12-13 DIAGNOSIS — N938 Other specified abnormal uterine and vaginal bleeding: Secondary | ICD-10-CM

## 2023-12-13 DIAGNOSIS — F429 Obsessive-compulsive disorder, unspecified: Secondary | ICD-10-CM | POA: Diagnosis not present

## 2023-12-13 DIAGNOSIS — Z13220 Encounter for screening for lipoid disorders: Secondary | ICD-10-CM

## 2023-12-13 DIAGNOSIS — F341 Dysthymic disorder: Secondary | ICD-10-CM | POA: Diagnosis not present

## 2023-12-13 LAB — CBC WITH DIFFERENTIAL/PLATELET
Basophils Absolute: 0 10*3/uL (ref 0.0–0.1)
Basophils Relative: 0.9 % (ref 0.0–3.0)
Eosinophils Absolute: 0.1 10*3/uL (ref 0.0–0.7)
Eosinophils Relative: 2.7 % (ref 0.0–5.0)
HCT: 40 % (ref 36.0–46.0)
Hemoglobin: 13.6 g/dL (ref 12.0–15.0)
Lymphocytes Relative: 32 % (ref 12.0–46.0)
Lymphs Abs: 1.7 10*3/uL (ref 0.7–4.0)
MCHC: 33.9 g/dL (ref 30.0–36.0)
MCV: 90.2 fL (ref 78.0–100.0)
Monocytes Absolute: 0.2 10*3/uL (ref 0.1–1.0)
Monocytes Relative: 4.7 % (ref 3.0–12.0)
Neutro Abs: 3.1 10*3/uL (ref 1.4–7.7)
Neutrophils Relative %: 59.7 % (ref 43.0–77.0)
Platelets: 300 10*3/uL (ref 150.0–400.0)
RBC: 4.44 Mil/uL (ref 3.87–5.11)
RDW: 13.7 % (ref 11.5–15.5)
WBC: 5.2 10*3/uL (ref 4.0–10.5)

## 2023-12-13 LAB — LIPID PANEL
Cholesterol: 231 mg/dL — ABNORMAL HIGH (ref 0–200)
HDL: 43.9 mg/dL (ref >39.00)
LDL Cholesterol: 151 mg/dL — ABNORMAL HIGH (ref 0–99)
NonHDL: 186.99
Total CHOL/HDL Ratio: 5
Triglycerides: 178 mg/dL — ABNORMAL HIGH (ref 0.0–149.0)
VLDL: 35.6 mg/dL (ref 0.0–40.0)

## 2023-12-13 LAB — TSH: TSH: 2.85 u[IU]/mL (ref 0.35–5.50)

## 2023-12-13 LAB — COMPREHENSIVE METABOLIC PANEL
ALT: 31 U/L (ref 0–35)
AST: 24 U/L (ref 0–37)
Albumin: 4.1 g/dL (ref 3.5–5.2)
Alkaline Phosphatase: 64 U/L (ref 39–117)
BUN: 17 mg/dL (ref 6–23)
CO2: 24 meq/L (ref 19–32)
Calcium: 9 mg/dL (ref 8.4–10.5)
Chloride: 104 meq/L (ref 96–112)
Creatinine, Ser: 0.68 mg/dL (ref 0.40–1.20)
GFR: 109.39 mL/min (ref >60.00)
Glucose, Bld: 88 mg/dL (ref 70–99)
Potassium: 4 meq/L (ref 3.5–5.1)
Sodium: 137 meq/L (ref 135–145)
Total Bilirubin: 0.5 mg/dL (ref 0.2–1.2)
Total Protein: 7 g/dL (ref 6.0–8.3)
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
## 2023-12-14 LAB — HEPATITIS C ANTIBODY: Hepatitis C Ab: NONREACTIVE

## 2023-12-15 ENCOUNTER — Encounter: Payer: Self-pay | Admitting: Family

## 2023-12-15 DIAGNOSIS — R7989 Other specified abnormal findings of blood chemistry: Secondary | ICD-10-CM

## 2023-12-15 DIAGNOSIS — Z83438 Family history of other disorder of lipoprotein metabolism and other lipidemia: Secondary | ICD-10-CM

## 2023-12-15 NOTE — Telephone Encounter (Signed)
 See note

## 2023-12-19 ENCOUNTER — Ambulatory Visit: Payer: 59 | Admitting: Family

## 2024-01-24 ENCOUNTER — Other Ambulatory Visit (HOSPITAL_COMMUNITY): Payer: Self-pay

## 2024-01-24 MED ORDER — FLUVOXAMINE MALEATE 100 MG PO TABS
200.0000 mg | ORAL_TABLET | Freq: Every day | ORAL | 1 refills | Status: AC
  Filled 2024-01-24: qty 180, 90d supply, fill #0

## 2024-02-03 ENCOUNTER — Other Ambulatory Visit (HOSPITAL_COMMUNITY): Payer: Self-pay

## 2024-02-07 ENCOUNTER — Ambulatory Visit: Payer: 59 | Admitting: Skilled Nursing Facility1

## 2024-02-20 ENCOUNTER — Other Ambulatory Visit: Payer: Self-pay | Admitting: Obstetrics and Gynecology

## 2024-03-26 DIAGNOSIS — Z01419 Encounter for gynecological examination (general) (routine) without abnormal findings: Secondary | ICD-10-CM | POA: Diagnosis not present

## 2024-03-26 DIAGNOSIS — Z113 Encounter for screening for infections with a predominantly sexual mode of transmission: Secondary | ICD-10-CM | POA: Diagnosis not present

## 2024-04-13 ENCOUNTER — Ambulatory Visit (INDEPENDENT_AMBULATORY_CARE_PROVIDER_SITE_OTHER): Admitting: Family

## 2024-04-13 VITALS — BP 118/78 | HR 92 | Temp 98.1°F | Ht 65.0 in | Wt 203.8 lb

## 2024-04-13 DIAGNOSIS — L739 Follicular disorder, unspecified: Secondary | ICD-10-CM
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
## 2024-05-07 ENCOUNTER — Encounter: Payer: Self-pay | Admitting: Family

## 2024-05-11 DIAGNOSIS — Z01818 Encounter for other preprocedural examination: Secondary | ICD-10-CM | POA: Diagnosis not present

## 2024-05-11 DIAGNOSIS — N898 Other specified noninflammatory disorders of vagina: Secondary | ICD-10-CM | POA: Diagnosis not present

## 2024-05-17 ENCOUNTER — Ambulatory Visit (HOSPITAL_COMMUNITY): Admit: 2024-05-17 | Payer: Self-pay | Admitting: Obstetrics and Gynecology

## 2024-05-17 SURGERY — HYSTERECTOMY, TOTAL, LAPAROSCOPIC, WITH SALPINGECTOMY
Anesthesia: General

## 2024-07-09 ENCOUNTER — Ambulatory Visit (HOSPITAL_COMMUNITY): Admit: 2024-07-09 | Admitting: Obstetrics and Gynecology

## 2024-07-09 SURGERY — HYSTERECTOMY, TOTAL, LAPAROSCOPIC, WITH SALPINGECTOMY
Anesthesia: General

## 2024-07-24 DIAGNOSIS — F429 Obsessive-compulsive disorder, unspecified: Secondary | ICD-10-CM | POA: Diagnosis not present

## 2024-07-24 DIAGNOSIS — F411 Generalized anxiety disorder: Secondary | ICD-10-CM | POA: Diagnosis not present

## 2024-07-26 ENCOUNTER — Encounter: Admitting: Family

## 2024-07-27 ENCOUNTER — Other Ambulatory Visit: Payer: Self-pay | Admitting: Obstetrics and Gynecology

## 2024-08-20 ENCOUNTER — Encounter (HOSPITAL_COMMUNITY): Payer: Self-pay | Admitting: Obstetrics and Gynecology

## 2024-08-20 NOTE — Progress Notes (Signed)
 Spoke w/ via phone for pre-op interview---  pt Lab needs dos----  upt   (per anes)    Lab results------  lab appt 08-23-2024 @ 0900 getting CBC/ T&S (per anes) COVID test -----patient states asymptomatic no test needed Arrive at -------  0845 on 08-24-2024 NPO after MN NO Solid Food.  Clear liquids from MN until--- 0745 Pre-Surgery Ensure or G2: n/a  Med rec completed Medications to take morning of surgery ----- pepcid  Diabetic medication ----- n/a  GLP1 agonist last dose:  n/a GLP1 instructions:  Patient instructed no nail polish to be worn day of surgery Patient instructed to bring photo id and insurance card day of surgery Patient aware to have Driver (ride ) / caregiver    for 24 hours after surgery - husband, brandon Lacey Patient Special Instructions ----- will pick up soap and written instructions at lab appt Pre-Op special Instructions ----- sent inbox message in epic on 08-17-2021 to Dr Henry , pre-op orders need second sign and informed consent needs to be re-entered due to no pre-op diagnosis (reason) it has her name.  Received reply today from Dr Henry stated she received and okay will do.  Patient verbalized understanding of instructions that were given at this phone interview. Patient denies chest pain, sob, fever, cough at the interview.

## 2024-08-20 NOTE — Pre-Procedure Instructions (Signed)
 Surgical Instructions   Your procedure is scheduled on :  Friday,  08-24-2024. Report to [facility] Main Entrance A at 8:45  A.M., then check in with the Admitting office. Any questions or running late day of surgery: call [phone]  Questions prior to your surgery date: call [phone], Monday-Friday, 8am-4pm. If you experience any cold or flu symptoms such as cough, fever, chills, shortness of breath, etc. between now and your scheduled surgery, please notify surgeon office.    Remember:  Do not eat any food after midnight the night before your surgery.  You may have clear liquids from midnight night before surgery until 7:45 AM.  Clear liquids allowed are:  Water Carbonated Beverages Clear Tea (no milk, honey, etc.) Black Coffee Only (NO MILK, CREAM OR POWDERED CREAMER of any kind) Sport drinks, like Gatorade.  NO clear liquids after 7:45 AM day of surgery.  This includes no water,  candy,  gum,  and  mints.    Take these medicines the morning of surgery with A SIPS OF WATER : Famotidine  (pepcid )   May take these medicines IF NEEDED: Hydroxyzine     One week prior to surgery, STOP taking any Aspirin (unless otherwise instructed by your surgeon) Aleve, Naproxen, Ibuprofen , Motrin , Advil , Goody's, BC's, all herbal medications, fish oil, and non-prescription vitamins.                     Do NOT Smoke (Tobacco/Vaping) and Do Not drink alcohol for 24 hours prior to your procedure.  If you use a CPAP at night, you may bring your mask/headgear for your overnight stay.   You will be asked to remove any contacts, glasses, piercing's, hearing aid's, dentures/partials prior to surgery. Please bring cases/ contact solution/ etc., for these items day of surgery.   Patients discharged the day of surgery will not be allowed to drive home, and someone needs to stay with them for 24 hours.  SURGICAL WAITING ROOM VISITATION Patients may have no more than 2 support people in the  waiting area - these visitors may rotate.   Pre-op nurse will coordinate an appropriate time for 1 ADULT support person, who may not rotate, to accompany patient in pre-op.  Children under the age of 66 must have an adult with them who is not the patient and must remain in the main waiting area with an adult.  If the patient needs to stay at the hospital during part of their recovery, the visitor guidelines for inpatient rooms apply.  Please refer to the [facility] website for the visitor guidelines for any additional information.   If you received a COVID test during your pre-op visit  it is requested that you wear a mask when out in public, stay away from anyone that may not be feeling well and notify your surgeon if you develop symptoms. If you have been in contact with anyone that has tested positive in the last 10 days please notify you surgeon.      Pre-operative CHG Bathing Instructions   You can play a key role in reducing the risk of infection after surgery. Your skin needs to be as free of germs as possible. You can reduce the number of germs on your skin by washing with CHG (chlorhexidine  gluconate) soap before surgery. CHG is an antiseptic soap that kills germs and continues to kill germs even after washing.   DO NOT use if you have an allergy to chlorhexidine /CHG or antibacterial soaps. If your skin  becomes reddened or irritated, stop using the CHG and notify one of our RN day of surgery.             TAKE A SHOWER THE NIGHT BEFORE SURGERY AND THE DAY OF SURGERY    Please keep in mind the following:  DO NOT shave, including legs and genital area, 48 hours prior to surgery.   You may shave your face before/day of surgery.  Place clean sheets on your bed the night before surgery Use a clean washcloth (not used since being washed) for each shower. DO NOT sleep with pet's night before surgery.  CHG Shower Instructions:  Wash your face and private area with normal soap. If you  choose to wash your hair, wash first with your normal shampoo.  After you use shampoo/soap, rinse your hair and body thoroughly to remove shampoo/soap residue.  Turn the water OFF and apply half the bottle of CHG soap to a CLEAN washcloth.  Apply CHG soap ONLY FROM YOUR NECK DOWN TO YOUR TOES (washing for 3-5 minutes)  DO NOT use CHG soap on face, private areas, open wounds, or sores.  Pay special attention to the area where your surgery is being performed.  If you are having back surgery, having someone wash your back for you may be helpful. Wait 2 minutes after CHG soap is applied, then you may rinse off the CHG soap.  Pat dry with a clean towel  Put on clean pajamas    Additional instructions for the day of surgery: DO NOT APPLY any lotions,  powder,  oils,  deodorants (may use underarm deodorant) , cologne/  perfumes or makeup Do not wear jewelry /  piercing's/ metal/  permanent jewelry must be removed prior to arrival day of surgery. (No plastic piercing) Do not wear nail polish, gel polish, artificial nails, or any other type of covering on natural finger nails (toe nails are okay) Do not bring valuables to the hospital. [facility] is not responsible for valuables/personal belongings. Put on clean/comfortable clothes.  Please brush your teeth and rinse mouth out. Ask your nurse before applying any prescription medications to the skin.

## 2024-08-23 ENCOUNTER — Other Ambulatory Visit: Payer: Self-pay | Admitting: Obstetrics and Gynecology

## 2024-08-23 ENCOUNTER — Encounter (HOSPITAL_COMMUNITY)
Admission: RE | Admit: 2024-08-23 | Discharge: 2024-08-23 | Disposition: A | Discharge status: Home / Self Care | Source: Ambulatory Visit | Attending: Obstetrics and Gynecology | Admitting: Obstetrics and Gynecology

## 2024-08-23 DIAGNOSIS — Z01818 Encounter for other preprocedural examination: Secondary | ICD-10-CM

## 2024-08-23 DIAGNOSIS — Z01812 Encounter for preprocedural laboratory examination: Secondary | ICD-10-CM | POA: Insufficient documentation

## 2024-08-23 LAB — CBC
HCT: 39.5 % (ref 36.0–46.0)
Hemoglobin: 12.9 g/dL (ref 12.0–15.0)
MCH: 29.7 pg (ref 26.0–34.0)
MCHC: 32.7 g/dL (ref 30.0–36.0)
MCV: 91 fL (ref 80.0–100.0)
Platelets: 297 K/uL (ref 150–400)
RBC: 4.34 MIL/uL (ref 3.87–5.11)
RDW: 13 % (ref 11.5–15.5)
WBC: 6.6 K/uL (ref 4.0–10.5)
nRBC: 0 % (ref 0.0–0.2)

## 2024-08-23 LAB — TYPE AND SCREEN
ABO/RH(D): A POS
Antibody Screen: NEGATIVE

## 2024-08-24 ENCOUNTER — Encounter (HOSPITAL_COMMUNITY): Payer: Self-pay | Admitting: Obstetrics and Gynecology

## 2024-08-24 ENCOUNTER — Other Ambulatory Visit: Payer: Self-pay

## 2024-08-24 ENCOUNTER — Encounter (HOSPITAL_COMMUNITY)
Admission: RE | Disposition: A | Discharge status: Home / Self Care | Payer: Self-pay | Source: Home / Self Care | Attending: Obstetrics and Gynecology

## 2024-08-24 ENCOUNTER — Ambulatory Visit (HOSPITAL_COMMUNITY)
Admission: RE | Admit: 2024-08-24 | Discharge: 2024-08-25 | Disposition: A | Discharge status: Home / Self Care | Attending: Obstetrics and Gynecology | Admitting: Obstetrics and Gynecology

## 2024-08-24 ENCOUNTER — Ambulatory Visit (HOSPITAL_COMMUNITY)

## 2024-08-24 DIAGNOSIS — K219 Gastro-esophageal reflux disease without esophagitis: Secondary | ICD-10-CM | POA: Diagnosis not present

## 2024-08-24 DIAGNOSIS — Z90710 Acquired absence of both cervix and uterus: Secondary | ICD-10-CM | POA: Diagnosis not present

## 2024-08-24 DIAGNOSIS — D252 Subserosal leiomyoma of uterus: Secondary | ICD-10-CM | POA: Diagnosis not present

## 2024-08-24 DIAGNOSIS — Z01818 Encounter for other preprocedural examination: Secondary | ICD-10-CM

## 2024-08-24 DIAGNOSIS — N946 Dysmenorrhea, unspecified: Secondary | ICD-10-CM | POA: Diagnosis not present

## 2024-08-24 DIAGNOSIS — N888 Other specified noninflammatory disorders of cervix uteri: Secondary | ICD-10-CM | POA: Diagnosis not present

## 2024-08-24 DIAGNOSIS — N944 Primary dysmenorrhea: Secondary | ICD-10-CM | POA: Diagnosis not present

## 2024-08-24 DIAGNOSIS — N920 Excessive and frequent menstruation with regular cycle: Secondary | ICD-10-CM | POA: Diagnosis not present

## 2024-08-24 DIAGNOSIS — E039 Hypothyroidism, unspecified: Secondary | ICD-10-CM | POA: Insufficient documentation

## 2024-08-24 DIAGNOSIS — N921 Excessive and frequent menstruation with irregular cycle: Secondary | ICD-10-CM | POA: Insufficient documentation

## 2024-08-24 DIAGNOSIS — N938 Other specified abnormal uterine and vaginal bleeding: Secondary | ICD-10-CM | POA: Diagnosis not present

## 2024-08-24 DIAGNOSIS — D259 Leiomyoma of uterus, unspecified: Secondary | ICD-10-CM | POA: Diagnosis not present

## 2024-08-24 DIAGNOSIS — N994 Postprocedural pelvic peritoneal adhesions: Secondary | ICD-10-CM | POA: Diagnosis not present

## 2024-08-24 HISTORY — PX: TOTAL LAPAROSCOPIC HYSTERECTOMY WITH SALPINGECTOMY: SHX6742

## 2024-08-24 HISTORY — PX: CYSTOSCOPY: SHX5120

## 2024-08-24 HISTORY — DX: Migraine without aura, not intractable, without status migrainosus: G43.009

## 2024-08-24 HISTORY — DX: Generalized anxiety disorder: F41.1

## 2024-08-24 HISTORY — DX: Personal history of other mental and behavioral disorders: Z86.59

## 2024-08-24 HISTORY — DX: Rosacea, unspecified: L71.9

## 2024-08-24 HISTORY — DX: Chronic idiopathic constipation: K59.04

## 2024-08-24 HISTORY — PX: LAPAROSCOPIC LYSIS OF ADHESIONS: SHX5905

## 2024-08-24 HISTORY — DX: Excessive and frequent menstruation with regular cycle: N92.0

## 2024-08-24 HISTORY — DX: Gastro-esophageal reflux disease without esophagitis: K21.9

## 2024-08-24 HISTORY — DX: Excessive and frequent menstruation with irregular cycle: N92.1

## 2024-08-24 HISTORY — DX: Personal history of diseases of the blood and blood-forming organs and certain disorders involving the immune mechanism: Z86.2

## 2024-08-24 LAB — POCT PREGNANCY, URINE: Preg Test, Ur: NEGATIVE

## 2024-08-24 SURGERY — HYSTERECTOMY, TOTAL, LAPAROSCOPIC, WITH SALPINGECTOMY
Anesthesia: General | Site: Bladder

## 2024-08-24 MED ORDER — SODIUM CHLORIDE 0.9% FLUSH: 9.0000 mL | INTRAVENOUS | Status: DC | PRN

## 2024-08-24 MED ORDER — OXYCODONE HCL 5 MG PO TABS: 5.0000 mg | ORAL_TABLET | Freq: Once | ORAL | Status: DC | PRN

## 2024-08-24 MED ORDER — PHENYLEPHRINE HCL-NACL 20-0.9 MG/250ML-% IV SOLN
INTRAVENOUS | Status: DC | PRN
  Administered 2024-08-24: 15 ug/min via INTRAVENOUS

## 2024-08-24 MED ORDER — FENTANYL CITRATE (PF) 100 MCG/2ML IJ SOLN
INTRAMUSCULAR | Status: AC
  Filled 2024-08-24: qty 2

## 2024-08-24 MED ORDER — LIDOCAINE 2% (20 MG/ML) 5 ML SYRINGE
INTRAMUSCULAR | Status: DC | PRN
Start: 2024-08-24 — End: 2024-08-24
  Administered 2024-08-24: 80 mg via INTRAVENOUS

## 2024-08-24 MED ORDER — SILVER NITRATE-POT NITRATE 75-25 % EX MISC
CUTANEOUS | Status: DC | PRN
  Administered 2024-08-24: 8

## 2024-08-24 MED ORDER — MIDAZOLAM HCL 2 MG/2ML IJ SOLN
INTRAMUSCULAR | Status: DC | PRN
Start: 2024-08-24 — End: 2024-08-24
  Administered 2024-08-24: 2 mg via INTRAVENOUS

## 2024-08-24 MED ORDER — BUPIVACAINE HCL (PF) 0.25 % IJ SOLN
INTRAMUSCULAR | Status: AC
  Filled 2024-08-24: qty 30

## 2024-08-24 MED ORDER — CHLORHEXIDINE GLUCONATE 0.12 % MT SOLN
15.0000 mL | Freq: Once | OROMUCOSAL | Status: AC
  Administered 2024-08-24: 15 mL via OROMUCOSAL

## 2024-08-24 MED ORDER — PROPOFOL 10 MG/ML IV BOLUS
INTRAVENOUS | Status: AC
  Filled 2024-08-24: qty 20

## 2024-08-24 MED ORDER — ONDANSETRON HCL 4 MG/2ML IJ SOLN
INTRAMUSCULAR | Status: DC | PRN
  Administered 2024-08-24 (×2): 4 mg via INTRAVENOUS

## 2024-08-24 MED ORDER — HYDROXYZINE HCL 25 MG PO TABS
12.5000 mg | ORAL_TABLET | Freq: Two times a day (BID) | ORAL | Status: DC | PRN
Start: 2024-08-24 — End: 2024-08-25

## 2024-08-24 MED ORDER — OXYCODONE HCL 5 MG/5ML PO SOLN: 5.0000 mg | Freq: Once | ORAL | Status: DC | PRN

## 2024-08-24 MED ORDER — 0.9 % SODIUM CHLORIDE (POUR BTL) OPTIME
TOPICAL | Status: DC | PRN
  Administered 2024-08-24: 1000 mL

## 2024-08-24 MED ORDER — FLUVOXAMINE MALEATE 100 MG PO TABS
200.0000 mg | ORAL_TABLET | Freq: Every day | ORAL | Status: DC
  Administered 2024-08-24: 200 mg via ORAL
  Filled 2024-08-24 (×2): qty 2

## 2024-08-24 MED ORDER — ARTIFICIAL TEARS OPHTHALMIC OINT
TOPICAL_OINTMENT | OPHTHALMIC | Status: AC
  Filled 2024-08-24: qty 3.5

## 2024-08-24 MED ORDER — ROCURONIUM BROMIDE 10 MG/ML (PF) SYRINGE
PREFILLED_SYRINGE | INTRAVENOUS | Status: AC
  Filled 2024-08-24: qty 10

## 2024-08-24 MED ORDER — BUPIVACAINE HCL (PF) 0.25 % IJ SOLN
INTRAMUSCULAR | Status: DC | PRN
  Administered 2024-08-24: 19 mL

## 2024-08-24 MED ORDER — AMISULPRIDE (ANTIEMETIC) 5 MG/2ML IV SOLN: 10.0000 mg | Freq: Once | INTRAVENOUS | Status: DC | PRN

## 2024-08-24 MED ORDER — EPHEDRINE SULFATE (PRESSORS) 50 MG/ML IJ SOLN
INTRAMUSCULAR | Status: DC | PRN
  Administered 2024-08-24: 5 mg via INTRAVENOUS
  Administered 2024-08-24: 7.5 mg via INTRAVENOUS

## 2024-08-24 MED ORDER — HYDROMORPHONE HCL 1 MG/ML IJ SOLN
INTRAMUSCULAR | Status: AC
  Filled 2024-08-24: qty 1

## 2024-08-24 MED ORDER — PHENYLEPHRINE 80 MCG/ML (10ML) SYRINGE FOR IV PUSH (FOR BLOOD PRESSURE SUPPORT)
PREFILLED_SYRINGE | INTRAVENOUS | Status: AC
Start: 2024-08-24 — End: 2024-08-24
  Filled 2024-08-24: qty 10

## 2024-08-24 MED ORDER — ROCURONIUM BROMIDE 10 MG/ML (PF) SYRINGE
PREFILLED_SYRINGE | INTRAVENOUS | Status: DC | PRN
  Administered 2024-08-24: 10 mg via INTRAVENOUS
  Administered 2024-08-24: 20 mg via INTRAVENOUS
  Administered 2024-08-24: 30 mg via INTRAVENOUS
  Administered 2024-08-24: 50 mg via INTRAVENOUS

## 2024-08-24 MED ORDER — MIDAZOLAM HCL 2 MG/2ML IJ SOLN
INTRAMUSCULAR | Status: AC
  Filled 2024-08-24: qty 2

## 2024-08-24 MED ORDER — DIPHENHYDRAMINE HCL 50 MG/ML IJ SOLN: 12.5000 mg | Freq: Four times a day (QID) | INTRAMUSCULAR | Status: DC | PRN

## 2024-08-24 MED ORDER — KETAMINE HCL 10 MG/ML IJ SOLN
INTRAMUSCULAR | Status: DC | PRN
  Administered 2024-08-24: 30 mg via INTRAVENOUS

## 2024-08-24 MED ORDER — ONDANSETRON HCL 4 MG/2ML IJ SOLN
INTRAMUSCULAR | Status: AC
  Filled 2024-08-24: qty 2

## 2024-08-24 MED ORDER — DIPHENHYDRAMINE HCL 12.5 MG/5ML PO ELIX: 12.5000 mg | ORAL_SOLUTION | Freq: Four times a day (QID) | ORAL | Status: DC | PRN

## 2024-08-24 MED ORDER — HYDROMORPHONE 1 MG/ML IV SOLN
INTRAVENOUS | Status: DC
  Administered 2024-08-24: 30 mg via INTRAVENOUS
  Filled 2024-08-24: qty 30

## 2024-08-24 MED ORDER — LISDEXAMFETAMINE DIMESYLATE 10 MG PO CAPS: 10.0000 mg | ORAL_CAPSULE | Freq: Every day | ORAL | Status: DC

## 2024-08-24 MED ORDER — DEXAMETHASONE SODIUM PHOSPHATE 10 MG/ML IJ SOLN
INTRAMUSCULAR | Status: AC
  Filled 2024-08-24: qty 1

## 2024-08-24 MED ORDER — HYDROMORPHONE HCL 1 MG/ML IJ SOLN
INTRAMUSCULAR | Status: AC
  Filled 2024-08-24: qty 0.5

## 2024-08-24 MED ORDER — ORAL CARE MOUTH RINSE: 15.0000 mL | Freq: Once | OROMUCOSAL | Status: AC

## 2024-08-24 MED ORDER — NALOXONE HCL 0.4 MG/ML IJ SOLN: 0.4000 mg | INTRAMUSCULAR | Status: DC | PRN

## 2024-08-24 MED ORDER — ACETAMINOPHEN 500 MG PO TABS
1000.0000 mg | ORAL_TABLET | Freq: Once | ORAL | Status: AC
  Administered 2024-08-24: 1000 mg via ORAL

## 2024-08-24 MED ORDER — PHENYLEPHRINE 80 MCG/ML (10ML) SYRINGE FOR IV PUSH (FOR BLOOD PRESSURE SUPPORT)
PREFILLED_SYRINGE | INTRAVENOUS | Status: DC | PRN
  Administered 2024-08-24 (×2): 80 ug via INTRAVENOUS

## 2024-08-24 MED ORDER — ONDANSETRON HCL 4 MG/2ML IJ SOLN: 4.0000 mg | Freq: Four times a day (QID) | INTRAMUSCULAR | Status: DC | PRN

## 2024-08-24 MED ORDER — LACTATED RINGERS IV SOLN: INTRAVENOUS | Status: DC

## 2024-08-24 MED ORDER — PROPOFOL 10 MG/ML IV BOLUS
INTRAVENOUS | Status: DC | PRN
  Administered 2024-08-24: 150 mg via INTRAVENOUS
  Administered 2024-08-24: 50 mg via INTRAVENOUS
  Administered 2024-08-24: 25 ug/kg/min via INTRAVENOUS

## 2024-08-24 MED ORDER — HYDROMORPHONE HCL 2 MG PO TABS: 1.0000 mg | ORAL_TABLET | ORAL | Status: DC | PRN

## 2024-08-24 MED ORDER — ALBUMIN HUMAN 5 % IV SOLN: INTRAVENOUS | Status: DC | PRN

## 2024-08-24 MED ORDER — KETAMINE HCL 50 MG/5ML IJ SOSY
PREFILLED_SYRINGE | INTRAMUSCULAR | Status: AC
Start: 2024-08-24 — End: 2024-08-24
  Filled 2024-08-24: qty 5

## 2024-08-24 MED ORDER — HEMOSTATIC AGENTS (NO CHARGE) OPTIME
TOPICAL | Status: DC | PRN
  Administered 2024-08-24: 1

## 2024-08-24 MED ORDER — CEFAZOLIN SODIUM-DEXTROSE 2-4 GM/100ML-% IV SOLN
2.0000 g | Freq: Once | INTRAVENOUS | Status: AC
  Administered 2024-08-24: 2 g via INTRAVENOUS

## 2024-08-24 MED ORDER — HYDROMORPHONE HCL 1 MG/ML IJ SOLN
INTRAMUSCULAR | Status: DC | PRN
  Administered 2024-08-24: .5 mg via INTRAVENOUS

## 2024-08-24 MED ORDER — PHENYLEPHRINE 80 MCG/ML (10ML) SYRINGE FOR IV PUSH (FOR BLOOD PRESSURE SUPPORT)
PREFILLED_SYRINGE | INTRAVENOUS | Status: AC
  Filled 2024-08-24: qty 10

## 2024-08-24 MED ORDER — ACETAMINOPHEN 500 MG PO TABS
ORAL_TABLET | ORAL | Status: AC
  Filled 2024-08-24: qty 2

## 2024-08-24 MED ORDER — CHLORHEXIDINE GLUCONATE 0.12 % MT SOLN
OROMUCOSAL | Status: AC
  Filled 2024-08-24: qty 15

## 2024-08-24 MED ORDER — FENTANYL CITRATE (PF) 250 MCG/5ML IJ SOLN
INTRAMUSCULAR | Status: DC | PRN
  Administered 2024-08-24 (×3): 50 ug via INTRAVENOUS

## 2024-08-24 MED ORDER — FAMOTIDINE 20 MG PO TABS: 20.0000 mg | ORAL_TABLET | Freq: Every day | ORAL | Status: DC | PRN

## 2024-08-24 MED ORDER — SUGAMMADEX SODIUM 200 MG/2ML IV SOLN
INTRAVENOUS | Status: DC | PRN
  Administered 2024-08-24: 200 mg via INTRAVENOUS

## 2024-08-24 MED ORDER — POVIDONE-IODINE 10 % EX SWAB
2.0000 | Freq: Once | CUTANEOUS | Status: AC
  Administered 2024-08-24: 2 via TOPICAL

## 2024-08-24 MED ORDER — HYDROMORPHONE HCL 1 MG/ML IJ SOLN
0.2500 mg | INTRAMUSCULAR | Status: DC | PRN
  Administered 2024-08-24 (×2): 0.25 mg via INTRAVENOUS

## 2024-08-24 MED ORDER — SODIUM CHLORIDE 0.9 % IR SOLN
Status: DC | PRN
  Administered 2024-08-24 (×2): 1000 mL

## 2024-08-24 MED ORDER — LORATADINE 10 MG PO TABS: 10.0000 mg | ORAL_TABLET | Freq: Every day | ORAL | Status: DC | PRN

## 2024-08-24 MED ORDER — FENTANYL CITRATE (PF) 250 MCG/5ML IJ SOLN
INTRAMUSCULAR | Status: AC
  Filled 2024-08-24: qty 5

## 2024-08-24 MED ORDER — KETOROLAC TROMETHAMINE 30 MG/ML IJ SOLN
30.0000 mg | Freq: Four times a day (QID) | INTRAMUSCULAR | Status: AC
  Administered 2024-08-24 – 2024-08-25 (×4): 30 mg via INTRAVENOUS
  Filled 2024-08-24 (×4): qty 1

## 2024-08-24 MED ORDER — CEFAZOLIN SODIUM-DEXTROSE 2-4 GM/100ML-% IV SOLN
INTRAVENOUS | Status: AC
  Filled 2024-08-24: qty 100

## 2024-08-24 MED ORDER — ACETAMINOPHEN 500 MG PO TABS
1000.0000 mg | ORAL_TABLET | Freq: Four times a day (QID) | ORAL | Status: DC
  Administered 2024-08-24 – 2024-08-25 (×3): 1000 mg via ORAL
  Filled 2024-08-24 (×3): qty 2

## 2024-08-24 MED ORDER — FENTANYL CITRATE (PF) 100 MCG/2ML IJ SOLN
25.0000 ug | INTRAMUSCULAR | Status: DC | PRN
  Administered 2024-08-24: 25 ug via INTRAVENOUS
  Administered 2024-08-24: 50 ug via INTRAVENOUS
  Administered 2024-08-24: 25 ug via INTRAVENOUS

## 2024-08-24 MED ORDER — LIDOCAINE 2% (20 MG/ML) 5 ML SYRINGE
INTRAMUSCULAR | Status: AC
  Filled 2024-08-24: qty 5

## 2024-08-24 MED ORDER — IBUPROFEN 600 MG PO TABS: 600.0000 mg | ORAL_TABLET | Freq: Four times a day (QID) | ORAL | Status: DC

## 2024-08-24 SURGICAL SUPPLY — 79 items
APPLICATOR ARISTA FLEXITIP XL (MISCELLANEOUS) ×2 IMPLANT
BAG COUNTER SPONGE SURGICOUNT (BAG) ×5 IMPLANT
BARRIER ADHS 3X4 INTERCEED (GAUZE/BANDAGES/DRESSINGS) IMPLANT
BENZOIN TINCTURE PRP APPL 2/3 (GAUZE/BANDAGES/DRESSINGS) ×5 IMPLANT
COVER MAYO STAND STRL (DRAPES) ×5 IMPLANT
DEFOGGER SCOPE WARM SEASHARP (MISCELLANEOUS) ×2 IMPLANT
DERMABOND ADVANCED .7 DNX12 (GAUZE/BANDAGES/DRESSINGS) ×5 IMPLANT
DISSECTOR BLUNT TIP ENDO 5MM (MISCELLANEOUS) IMPLANT
DRAPE SURG IRRIG POUCH 19X23 (DRAPES) ×5 IMPLANT
DRAPE WARM FLUID 44X44 (DRAPES) IMPLANT
DRSG OPSITE POSTOP 4X10 (GAUZE/BANDAGES/DRESSINGS) ×5 IMPLANT
DURAPREP 26ML APPLICATOR (WOUND CARE) ×5 IMPLANT
GAUZE 4X4 16PLY ~~LOC~~+RFID DBL (SPONGE) ×12 IMPLANT
GLOVE BIO SURGEON STRL SZ 6.5 (GLOVE) ×4 IMPLANT
GLOVE BIO SURGEON STRL SZ7.5 (GLOVE) ×12 IMPLANT
GLOVE BIOGEL PI IND STRL 7.5 (GLOVE) ×10 IMPLANT
GLOVE SURG UNDER POLY LF SZ7 (GLOVE) ×15 IMPLANT
GOWN STRL REUS W/ TWL LRG LVL3 (GOWN DISPOSABLE) ×15 IMPLANT
GOWN STRL REUS W/ TWL XL LVL3 (GOWN DISPOSABLE) ×10 IMPLANT
HEMOSTAT ARISTA ABSORB 3G PWDR (HEMOSTASIS) ×2 IMPLANT
HEMOSTAT SURGICEL 2X14 (HEMOSTASIS) IMPLANT
HEMOSTAT SURGICEL 4X8 (HEMOSTASIS) IMPLANT
HIBICLENS CHG 4% 4OZ BTL (MISCELLANEOUS) ×10 IMPLANT
IRRIGATION SUCT STRKRFLW 2 WTP (MISCELLANEOUS) ×5 IMPLANT
KIT PINK PAD W/HEAD ARM REST (MISCELLANEOUS) ×10 IMPLANT
KIT TURNOVER KIT B (KITS) ×5 IMPLANT
LIGASURE LAP L-HOOK RET 5 37 (INSTRUMENTS) ×2 IMPLANT
MANIFOLD NEPTUNE II (INSTRUMENTS) ×5 IMPLANT
MANIPULATOR ADVINCU DEL 2.5 PL (MISCELLANEOUS) IMPLANT
MANIPULATOR ADVINCU DEL 3.0 PL (MISCELLANEOUS) IMPLANT
MANIPULATOR ADVINCU DEL 3.5 PL (MISCELLANEOUS) ×2 IMPLANT
MANIPULATOR ADVINCU DEL 4.0 PL (MISCELLANEOUS) IMPLANT
NDL HYPO 22X1.5 SAFETY MO (MISCELLANEOUS) IMPLANT
NDL INSUFFLATION 14GA 120MM (NEEDLE) ×3 IMPLANT
NEEDLE HYPO 22X1.5 SAFETY MO (MISCELLANEOUS) IMPLANT
NEEDLE INSUFFLATION 14GA 120MM (NEEDLE) ×5 IMPLANT
NS IRRIG 1000ML POUR BTL (IV SOLUTION) ×5 IMPLANT
OCCLUDER COLPOPNEUMO (BALLOONS) ×5 IMPLANT
PACK ABDOMINAL GYN (CUSTOM PROCEDURE TRAY) ×5 IMPLANT
PACK LAPAROSCOPY BASIN (CUSTOM PROCEDURE TRAY) ×5 IMPLANT
PAD ARMBOARD POSITIONER FOAM (MISCELLANEOUS) ×5 IMPLANT
PAD OB MATERNITY 11 LF (PERSONAL CARE ITEMS) ×5 IMPLANT
RTRCTR C-SECT PINK 25CM LRG (MISCELLANEOUS) IMPLANT
SCISSORS LAP 5X35 DISP (ENDOMECHANICALS) ×5 IMPLANT
SET CYSTO W/LG BORE CLAMP LF (SET/KITS/TRAYS/PACK) ×5 IMPLANT
SET TRI-LUMEN FLTR TB AIRSEAL (TUBING) IMPLANT
SET TUBE SMOKE EVAC HIGH FLOW (TUBING) ×5 IMPLANT
SHEARS HARMONIC 36 ACE (MISCELLANEOUS) IMPLANT
SHEET LAVH (DRAPES) ×5 IMPLANT
SLEEVE ADV FIXATION 5X100MM (TROCAR) IMPLANT
SOLUTION ELECTROSURG ANTI STCK (MISCELLANEOUS) IMPLANT
SPIKE FLUID TRANSFER (MISCELLANEOUS) ×5 IMPLANT
SPONGE INTESTINAL PEANUT (DISPOSABLE) IMPLANT
SPONGE T-LAP 18X18 ~~LOC~~+RFID (SPONGE) IMPLANT
STRIP CLOSURE SKIN 1/2X4 (GAUZE/BANDAGES/DRESSINGS) ×5 IMPLANT
SUT CHROMIC 2 0 CT 1 (SUTURE) ×5 IMPLANT
SUT CHROMIC 2 0 SH (SUTURE) IMPLANT
SUT MNCRL AB 3-0 PS2 27 (SUTURE) ×7 IMPLANT
SUT MON AB 3-0 SH27 (SUTURE) IMPLANT
SUT PDS AB 1 CT1 36 (SUTURE) ×10 IMPLANT
SUT PDS AB 1 CTX 36 (SUTURE) IMPLANT
SUT VIC AB 0 CT1 18XCR BRD8 (SUTURE) ×10 IMPLANT
SUT VIC AB 0 CT1 27XBRD ANBCTR (SUTURE) ×14 IMPLANT
SUT VICRYL 0 TIES 12 18 (SUTURE) ×5 IMPLANT
SUT VICRYL 0 UR6 27IN ABS (SUTURE) ×16 IMPLANT
SYR 50ML LL SCALE MARK (SYRINGE) ×5 IMPLANT
SYR CONTROL 10ML LL (SYRINGE) ×5 IMPLANT
TIP UTERINE 6.7X10CM GRN DISP (MISCELLANEOUS) IMPLANT
TIP UTERINE 6.7X6CM WHT DISP (MISCELLANEOUS) ×4 IMPLANT
TIP UTERINE 6.7X8CM BLUE DISP (MISCELLANEOUS) ×2 IMPLANT
TOWEL GREEN STERILE FF (TOWEL DISPOSABLE) ×10 IMPLANT
TRAY FOLEY W/BAG SLVR 14FR (SET/KITS/TRAYS/PACK) ×5 IMPLANT
TROCAR ADV FIXATION 11X100MM (TROCAR) ×10 IMPLANT
TROCAR ADV FIXATION 5X100MM (TROCAR) ×5 IMPLANT
TROCAR BALLN 12MMX100 BLUNT (TROCAR) IMPLANT
TROCAR PORT AIRSEAL 5X120 (TROCAR) IMPLANT
UNDERPAD 30X36 HEAVY ABSORB (UNDERPADS AND DIAPERS) ×5 IMPLANT
WARMER LAPAROSCOPE (MISCELLANEOUS) ×5 IMPLANT
YANKAUER SUCT BULB TIP NO VENT (SUCTIONS) ×2 IMPLANT

## 2024-08-24 NOTE — Anesthesia Preprocedure Evaluation (Addendum)
 Anesthesia Evaluation  Patient identified by MRN, date of birth, ID band Patient awake    Reviewed: Allergy & Precautions, NPO status , Patient's Chart, lab work & pertinent test results  Airway Mallampati: II  TM Distance: >3 FB Neck ROM: Full    Dental  (+) Chipped, Dental Advisory Given,    Pulmonary neg pulmonary ROS   Pulmonary exam normal breath sounds clear to auscultation       Cardiovascular negative cardio ROS Normal cardiovascular exam Rhythm:Regular Rate:Normal     Neuro/Psych  Headaches PSYCHIATRIC DISORDERS Anxiety Depression       GI/Hepatic Neg liver ROS,GERD  ,,  Endo/Other  Hypothyroidism    Renal/GU negative Renal ROS  negative genitourinary   Musculoskeletal negative musculoskeletal ROS (+)    Abdominal   Peds  Hematology negative hematology ROS (+)   Anesthesia Other Findings   Reproductive/Obstetrics                              Anesthesia Physical Anesthesia Plan  ASA: 2  Anesthesia Plan: General   Post-op Pain Management: Tylenol  PO (pre-op)*, Ketamine  IV* and Dilaudid  IV   Induction: Intravenous  PONV Risk Score and Plan: 3 and Midazolam , Dexamethasone  and Ondansetron   Airway Management Planned: Oral ETT  Additional Equipment:   Intra-op Plan:   Post-operative Plan: Extubation in OR  Informed Consent: I have reviewed the patients History and Physical, chart, labs and discussed the procedure including the risks, benefits and alternatives for the proposed anesthesia with the patient or authorized representative who has indicated his/her understanding and acceptance.     Dental advisory given  Plan Discussed with: CRNA  Anesthesia Plan Comments: (2 PIV)         Anesthesia Quick Evaluation

## 2024-08-24 NOTE — Progress Notes (Deleted)
 Chaplain responded to patient request for prayer before surgical procedure. Husband Penne and father bedside. Chaplains remain available as further needs arise.

## 2024-08-24 NOTE — Op Note (Signed)
 Preop Diagnosis: 1.DUB 2.Menometrorrhagia 3.Dysmenorrhea  Postop Diagnosis: 1.DUB 2.Menometrorrhagia 3.Dysmenorrhea 4.Adhesions  Procedure:  1.Total Laparoscopic Hysterectomy 2.BS 3.Cystoscopy 4.LOA  PR LAPS TOTAL HYSTERECT 250 GM/< W/RMVL TUBE/OVARY [58571] PR OOPHORECTOMY PARTIAL/TOTAL UNI/BI [58940] PR CYSTOURETHROSCOPY [52000] PR TOTAL ABDOMINAL HYSTERECT W/WO RMVL TUBE OVARY [58150]   Anesthesia: General   Anesthesiologist: No responsible provider has been recorded for the case.   Attending: Henry Slough, MD   Assistant: Ovid All, MD  Findings: Small anterior subserosal fibroid  Pathology: Uterus, cervix, bilateral fallipian tubes and one small 1-2cm fibroid  Fluids: 1500 cc  UOP: 400 cc  EBL: 150 cc  Complications: None  Procedure: The patient was taken to the operating room, placed under general anesthesia and prepped and draped in the normal sterile fashion. A Foley catheter was placed in the bladder. The uterus sounded to 8.  A weighted speculum and vaginal retractors were placed in the vagina.  Tenaculum was placed on the anterior lip of the cervix.  A size 6 cm tip was used and the rumi was placed, tip balloon and occluder insufflated but d/t h/o ablation tip balloon on insufflated to 1cc. The KOH was therefore stitched in place.  Attention was then turned to the abdomen. A 10 mm infraumbilical incision was made with the scalpel after 5 cc of 0.25 percent Marcaine  was used for local anesthesia. The Veress needle was placed in the intra-abdominal cavity and insufflation obtained.  A 10 mm trochar was advanced into the intra-abdominal cavity and the laparoscope was introduced.  A 5 mm trochar was placed in the right, an 8mm trochar for the air seal was placed in the left lower quadrant under direct visualization with the laparoscope and a 10 mm trochar was placed in the suprapubic area under direct visualization as well.  The right fallopian tube was excised  with the ligasure and the same was done on the contralateral side.  The ligasure was then used to cauterize and cut the uterine ovarian ligament on the right as well as the round ligament.  The same was done on the contralateral side.  The uterine artery on the right was cauterized and cut with ligasure and the same was done on the contralateral side.  The bladder was dissected away after incising several adhesions.  The retractable tip on the ligasure was then used to circumscribe the KOH ring and free the uterus and cervix on the right. The same was done on the contralateral side.  The uterus was then pulled into the vagina.  Both angles were sutured with 1 PDS.  The  remainder of the cuff was sutured with 1 PDS using interrupted stitches until the vaginal cuff was closed.  A total of approximately 6 sutures were used.   Irrigation was performed.  Gas was allowed to leave the abdomen to check for bleeders and all pedicles were seen to be hemostatic.  The vagina was inspected and the cuff was noted to be intact.  Cystoscopy was performed and both ureters were seen to efflux without difficulty. The bladder had full integrity with no suture or laceration visualized.  Attention was then turned back to the abdomen after removing top pair of gloves and regowning. The abdomen was reinsufflated with CO2 gas.   The abdomen and pelvis was copiously irrigated and  hemostasis was noted.  All trochars were removed under direct visualization using the laparoscope.  The two 10mm fascial incisions were reapproximated with 0 vicryl. The two 10 mm incisions were closed with  3-0 Monocryl via a subcuticular stitch.  The remaining skin incision was closed with Dermabond and the 10 mm skin incisions and 8mm incision were reinforced using Dermabond.  Sponge lap and needle counts were correct.  The patient tolerated the procedure well and was returned to the PACU in stable condition.  I was present and scrubbed and the assistant was  required due to complexity of anatomy.

## 2024-08-24 NOTE — Anesthesia Procedure Notes (Signed)
 Procedure Name: Intubation Date/Time: 08/24/2024 12:13 PM  Performed by: Kearney Rosina SAILOR, RNPre-anesthesia Checklist: Patient identified, Emergency Drugs available, Suction available and Patient being monitored Patient Re-evaluated:Patient Re-evaluated prior to induction Oxygen Delivery Method: Circle System Utilized Preoxygenation: Pre-oxygenation with 100% oxygen Induction Type: IV induction Ventilation: Mask ventilation without difficulty Laryngoscope Size: Mac and 3 Grade View: Grade I Tube type: Oral Tube size: 7.0 mm Number of attempts: 1 Airway Equipment and Method: Stylet and Oral airway Placement Confirmation: ETT inserted through vocal cords under direct vision, positive ETCO2 and breath sounds checked- equal and bilateral Secured at: 21 cm Tube secured with: Tape Dental Injury: Teeth and Oropharynx as per pre-operative assessment  Comments: atraumatic

## 2024-08-24 NOTE — H&P (Signed)
 Alison Kelly is an 38 y.o. female. Pt is well known to me and has been c/o menorrhagia and dysmenorrhea for yrs and declines further medical management and would like definitive mgmt with a hysterectomy.  Pertinent Gynecological History: Menses: heavy, prolonged, bleeding between cycles and painful, regular Bleeding: DUB Contraception: family planning Sexually transmitted diseases: no past history Previous GYN Procedures: h/o ablation 3-66yrs ago with intially tolerable control  Last mammogram: normal Date: 10/20/23 Last pap: normal  OB History: G2, P2002   Menstrual History: On cycle now No LMP recorded. (Menstrual status: Irregular Periods).    Past Medical History:  Diagnosis Date   Chronic idiopathic constipation    Excessive and frequent menstruation with irregular cycle    GAD (generalized anxiety disorder)    GERD (gastroesophageal reflux disease)    Gluten intolerance    pt stated this is celiac, but no per say dx   Hashimoto's disease    endocrinologist--- Alison Kelly 11-13-2021 in epic)   ([phone] due to levels normalized, pt stated last taken synthroid  2023)   History of anemia    History of malignant melanoma of skin 09/2012   08-20-2024 s/p  WLE excision left inner thigh,  localized ,  no recurrence   History of panic attacks    Hx of anorexia nervosa and bulimia 07/04/2013   Menorrhagia    Migraine without aura and without status migrainosus, not intractable    neurologist--- Alison Kelly;  cervicogenic migraine/ myofascial pain syndrome cervical   Obsessive compulsive disorder    OCD (obsessive compulsive disorder)    Rosacea    Vitamin D  deficiency     Past Surgical History:  Procedure Laterality Date   CESAREAN SECTION N/A 08/17/2013   Procedure: CESAREAN SECTION;  Surgeon: Alison SHAUNNA Muscat, MD;  Location: [facility];  Service: Obstetrics;  Laterality: N/A;   CESAREAN SECTION N/A 07/23/2016   Procedure: CESAREAN SECTION;  Surgeon: Alison Rigg, MD;   Location: [facility];  Service: Obstetrics;  Laterality: N/A;  RNFA requested - Alison Kelly   COLONOSCOPY  2018   ENDOMETRIAL ABLATION  2019   in GYN office Alison Kelly w/ Minerva   TONSILLECTOMY AND ADENOIDECTOMY  2002   WISDOM TOOTH EXTRACTION  2003    Family History  Problem Relation Age of Onset   Alcohol abuse Mother    Hypertension Mother    High Cholesterol Mother    Sudden death Mother    Depression Mother    Anxiety disorder Mother    Drug abuse Mother    Cancer Father        skin   Heart disease Father    Hypertension Father    Diabetes Father    High Cholesterol Father    Anxiety disorder Father    Hypertension Maternal Aunt    Peripheral vascular disease Maternal Aunt    Cancer Maternal Aunt        breast   Hypertension Maternal Uncle    Diabetes Maternal Uncle    Cancer Maternal Grandmother        skin   Heart disease Maternal Grandfather    Hypertension Maternal Grandfather    Peripheral vascular disease Maternal Grandfather    Stroke Maternal Grandfather    Diabetes Maternal Grandfather    Cancer Maternal Grandfather        colon   Stroke Paternal Grandmother    Alzheimer's disease Paternal Grandmother    Seizures Cousin    Seizures Cousin  Social History:  reports that she has never smoked. She has never used smokeless tobacco. She reports that she does not currently use alcohol. She reports that she does not use drugs.  Allergies:  Allergies  Allergen Reactions   Pineapple Swelling    Lips/tongue swelling, doesn't not bother breathing   Gluten Meal     Celiac   Hydrocodone Hives   Milk (Cow) Diarrhea and Nausea And Vomiting   Codeine Rash   Methotrexate Derivatives Hives and Rash   Prednisone Hives and Rash   Propoxyphene Hives and Rash    Darvocet   Sulfa Antibiotics Rash   Tape Rash    ADHESIVE    Medications Prior to Admission  Medication Sig Dispense Refill Last Dose/Taking   acetaminophen  (TYLENOL ) 500 MG tablet Take  1,000 mg by mouth every 6 (six) hours as needed.   Unknown   diphenhydrAMINE  (BENADRYL ) 25 MG tablet Take 25 mg by mouth every 6 (six) hours as needed (headache).   Past Week   famotidine  (PEPCID ) 20 MG tablet Take 20 mg by mouth daily as needed for heartburn or indigestion.   08/24/2024 at  7:23 AM   fluvoxaMINE  (LUVOX ) 100 MG tablet Take 2 tablets by mouth daily (Patient taking differently: Take 200 mg by mouth at bedtime.) 180 tablet 1 08/23/2024 Evening   ibuprofen  (ADVIL ) 200 MG tablet Take 400 mg by mouth every 6 (six) hours as needed.   Unknown   lisdexamfetamine (VYVANSE ) 20 MG capsule Take 10 mg by mouth daily.   08/19/2024   NONFORMULARY OR COMPOUNDED ITEM Apply 1 Application topically 2 (two) times daily. Triple rosacea cream azeliac acid 1.5%/ Metronidazole 1%/  Ivermectin 1%  cream   Past Week   cetirizine (ZYRTEC) 10 MG tablet Take 10 mg by mouth daily as needed for allergies.   More than a month   hydrOXYzine  (ATARAX ) 25 MG tablet Take 1/2-1 tablet (12.5-25 mg total) by mouth up to 2 (two) times daily as needed for anxiety. (Patient taking differently: Take 12.5-25 mg by mouth 2 (two) times daily as needed for anxiety.) 60 tablet 1 More than a month   loratadine  (CLARITIN ) 10 MG tablet Take 10 mg by mouth daily as needed for allergies.   More than a month    Review of Systems Denies F/C/N/V/D Blood pressure 133/87, pulse 87, temperature 98 F (36.7 C), temperature source Oral, resp. rate 17, height 5' 6 (1.676 m), weight 90.7 kg, SpO2 98%. Physical Exam Lungs unlabored breathing CV RRR Abdomen soft, NT Extremities no calf tenderness  Results for orders placed or performed during the hospital encounter of 08/24/24 (from the past 24 hours)  Pregnancy, urine POC     Status: None   Collection Time: 08/24/24  8:51 AM  Result Value Ref Range   Preg Test, Ur NEGATIVE NEGATIVE    No results found.  Assessment/Plan: 57bn [id number] presenting for scheduled TLH/BS/possible  cystoscopy/possible BSO/possible TAH.  Risks benefits alternatives reviewed but not limited to bleeding infection and injury.  Questions answered and consent signed and witnessed.  Alison Kelly 08/24/2024, 11:09 AM

## 2024-08-24 NOTE — Transfer of Care (Signed)
 Immediate Anesthesia Transfer of Care Note  Patient: Alison Kelly  Procedure(s) Performed: HYSTERECTOMY, TOTAL, LAPAROSCOPIC, WITH SALPINGECTOMY (Bilateral: Abdomen) CYSTOSCOPY (Bladder) LYSIS, ADHESIONS, LAPAROSCOPIC (Abdomen)  Patient Location: PACU  Anesthesia Type:General  Level of Consciousness: awake, alert , and oriented  Airway & Oxygen Therapy: Patient Spontanous Breathing  Post-op Assessment: Report given to RN and Post -op Vital signs reviewed and stable  Post vital signs: Reviewed and stable  Last Vitals:  Vitals Value Taken Time  BP 106/62 08/24/24 16:39  Temp    Pulse 93 08/24/24 16:41  Resp 16 08/24/24 16:41  SpO2 99 % 08/24/24 16:41  Vitals shown include unfiled device data.  Last Pain:  Vitals:   08/24/24 0914  TempSrc: Oral  PainSc: 2       Patients Stated Pain Goal: 5 (08/24/24 0914)  Complications: There were no known notable events for this encounter.

## 2024-08-24 NOTE — Progress Notes (Signed)
 Chaplain responded to patient request for prayer before surgical procedure. Husband Penne and father bedside. Chaplains remain available as further needs arise.

## 2024-08-24 NOTE — Plan of Care (Signed)

## 2024-08-25 ENCOUNTER — Other Ambulatory Visit (HOSPITAL_COMMUNITY): Payer: Self-pay

## 2024-08-25 DIAGNOSIS — N920 Excessive and frequent menstruation with regular cycle: Secondary | ICD-10-CM | POA: Diagnosis not present

## 2024-08-25 DIAGNOSIS — E039 Hypothyroidism, unspecified: Secondary | ICD-10-CM | POA: Diagnosis not present

## 2024-08-25 DIAGNOSIS — N921 Excessive and frequent menstruation with irregular cycle: Secondary | ICD-10-CM | POA: Diagnosis not present

## 2024-08-25 DIAGNOSIS — N888 Other specified noninflammatory disorders of cervix uteri: Secondary | ICD-10-CM | POA: Diagnosis not present

## 2024-08-25 DIAGNOSIS — Z90710 Acquired absence of both cervix and uterus: Secondary | ICD-10-CM | POA: Diagnosis not present

## 2024-08-25 DIAGNOSIS — D259 Leiomyoma of uterus, unspecified: Secondary | ICD-10-CM | POA: Diagnosis not present

## 2024-08-25 DIAGNOSIS — N946 Dysmenorrhea, unspecified: Secondary | ICD-10-CM | POA: Diagnosis not present

## 2024-08-25 DIAGNOSIS — K219 Gastro-esophageal reflux disease without esophagitis: Secondary | ICD-10-CM | POA: Diagnosis not present

## 2024-08-25 LAB — CBC
HCT: 33.2 % — ABNORMAL LOW (ref 36.0–46.0)
Hemoglobin: 10.7 g/dL — ABNORMAL LOW (ref 12.0–15.0)
MCH: 29.6 pg (ref 26.0–34.0)
MCHC: 32.2 g/dL (ref 30.0–36.0)
MCV: 91.7 fL (ref 80.0–100.0)
Platelets: 294 K/uL (ref 150–400)
RBC: 3.62 MIL/uL — ABNORMAL LOW (ref 3.87–5.11)
RDW: 13.2 % (ref 11.5–15.5)
WBC: 11.7 K/uL — ABNORMAL HIGH (ref 4.0–10.5)
nRBC: 0 % (ref 0.0–0.2)

## 2024-08-25 LAB — BASIC METABOLIC PANEL WITH GFR
Anion gap: 9 (ref 5–15)
BUN: 6 mg/dL (ref 6–20)
CO2: 25 mmol/L (ref 22–32)
Calcium: 8.6 mg/dL — ABNORMAL LOW (ref 8.9–10.3)
Chloride: 100 mmol/L (ref 98–111)
Creatinine, Ser: 0.75 mg/dL (ref 0.44–1.00)
GFR, Estimated: >60 mL/min (ref >60)
Glucose, Bld: 104 mg/dL — ABNORMAL HIGH (ref 70–99)
Potassium: 3.9 mmol/L (ref 3.5–5.1)
Sodium: 134 mmol/L — ABNORMAL LOW (ref 135–145)

## 2024-08-25 MED ORDER — HYDROMORPHONE HCL 2 MG PO TABS: 2.0000 mg | ORAL_TABLET | Freq: Four times a day (QID) | ORAL | Status: DC | PRN

## 2024-08-25 MED ORDER — HYDROMORPHONE HCL 2 MG PO TABS
2.0000 mg | ORAL_TABLET | Freq: Four times a day (QID) | ORAL | 0 refills | Status: AC | PRN
  Filled 2024-08-25: qty 30, 7d supply, fill #0

## 2024-08-25 MED ORDER — DOCUSATE SODIUM 100 MG PO CAPS
100.0000 mg | ORAL_CAPSULE | Freq: Two times a day (BID) | ORAL | 0 refills | Status: AC | PRN
  Filled 2024-08-25: qty 10, 5d supply, fill #0

## 2024-08-25 MED ORDER — IBUPROFEN 600 MG PO TABS
600.0000 mg | ORAL_TABLET | Freq: Four times a day (QID) | ORAL | 1 refills | Status: AC | PRN
  Filled 2024-08-25: qty 30, 8d supply, fill #0

## 2024-08-25 MED ORDER — ACETAMINOPHEN 500 MG PO TABS
1000.0000 mg | ORAL_TABLET | Freq: Four times a day (QID) | ORAL | 1 refills | Status: AC | PRN
  Filled 2024-08-25: qty 60, 8d supply, fill #0

## 2024-08-25 NOTE — Anesthesia Postprocedure Evaluation (Signed)
 Anesthesia Post Note  Patient: Alison Kelly  Procedure(s) Performed: HYSTERECTOMY, TOTAL, LAPAROSCOPIC, WITH SALPINGECTOMY (Bilateral: Abdomen) CYSTOSCOPY (Bladder) LYSIS, ADHESIONS, LAPAROSCOPIC (Abdomen)     Patient location during evaluation: PACU Anesthesia Type: General Level of consciousness: awake and alert Pain management: pain level controlled Vital Signs Assessment: post-procedure vital signs reviewed and stable Respiratory status: spontaneous breathing, nonlabored ventilation, respiratory function stable and patient connected to nasal cannula oxygen Cardiovascular status: blood pressure returned to baseline and stable Postop Assessment: no apparent nausea or vomiting Anesthetic complications: no   There were no known notable events for this encounter.                Lynwood MARLA Cornea

## 2024-08-25 NOTE — Discharge Summary (Signed)
 Physician Discharge Summary  Patient ID: Alison Kelly MRN: [id number] DOB/AGE: [birth year] 38 y.o.  Admit date: 08/24/2024 Discharge date: 09/06/2024  Admission Diagnoses: Menorrhagia Dysmenorrhea  Discharge Diagnoses:  Principal Problem:   Menorrhagia Active Problems:   S/P laparoscopic hysterectomy   Discharged Condition: Good  Hospital Course: Unremarkable with normal post op course.  Discharged on POD #1.  Consults: None  Significant Diagnostic Studies: blood work  Treatments: TLH/BS/Cystoscopy  Discharge Exam: Blood pressure 103/65, pulse 93, temperature 97.6 F (36.4 C), temperature source Oral, resp. rate 20, height 5' 6 (1.676 m), weight 90.7 kg, SpO2 98%. General appearance: alert, cooperative, and no distress Resp: clear to auscultation bilaterally Cardio: regular rate and rhythm Extremities: no calf tenderness Incision/Wound: C/d/dermabond intact  Disposition: Discharge disposition: 01-Home or Self Care        Allergies as of 08/25/2024       Reactions   Pineapple Swelling   Lips/tongue swelling, doesn't not bother breathing   Gluten Meal    Celiac   Hydrocodone Hives   Milk (cow) Diarrhea, Nausea And Vomiting   Codeine Rash   Methotrexate Derivatives Hives, Rash   Prednisone Hives, Rash   Propoxyphene Hives, Rash   Darvocet   Sulfa Antibiotics Rash   Tape Rash   ADHESIVE        Medication List     TAKE these medications    Acetaminophen  Extra Strength 500 MG Tabs Take 2 tablets (1,000 mg total) by mouth every 6 (six) hours as needed.   cetirizine 10 MG tablet Commonly known as: ZYRTEC Take 10 mg by mouth daily as needed for allergies.   docusate sodium  100 MG capsule Commonly known as: Colace Take 1 capsule (100 mg total) by mouth 2 (two) times daily as needed for mild constipation.   famotidine  20 MG tablet Commonly known as: PEPCID  Take 20 mg by mouth daily as needed for heartburn or indigestion.   fluvoxaMINE  100 MG  tablet Commonly known as: LUVOX  Take 2 tablets by mouth daily What changed: when to take this   HYDROmorphone  2 MG tablet Commonly known as: DILAUDID  Take 1 tablet (2 mg total) by mouth every 6 (six) hours as needed for severe pain (pain score 7-10) or moderate pain (pain score 4-6) ((when tolerating fluids)).   hydrOXYzine  25 MG tablet Commonly known as: ATARAX  Take 1/2-1 tablet (12.5-25 mg total) by mouth up to 2 (two) times daily as needed for anxiety. What changed: reasons to take this   ibuprofen  600 MG tablet Commonly known as: ADVIL  Take 1 tablet (600 mg total) by mouth every 6 (six) hours as needed for mild pain (pain score 1-3), moderate pain (pain score 4-6) or cramping.   lisdexamfetamine 20 MG capsule Commonly known as: VYVANSE  Take 10 mg by mouth daily.   loratadine  10 MG tablet Commonly known as: CLARITIN  Take 10 mg by mouth daily as needed for allergies.        Follow-up Information     Henry Slough, MD. Schedule an appointment as soon as possible for a visit in 6 week(s).   Specialty: Obstetrics and Gynecology Why: For post op follow up Contact information: [address] [place] [address] [phone]                 Signed: Slough CINDERELLA Henry 09/06/2024, 8:44 PM

## 2024-08-27 ENCOUNTER — Encounter (HOSPITAL_COMMUNITY): Payer: Self-pay | Admitting: Obstetrics and Gynecology

## 2024-08-28 LAB — SURGICAL PATHOLOGY

## 2024-09-06 DIAGNOSIS — R0681 Apnea, not elsewhere classified: Secondary | ICD-10-CM | POA: Diagnosis not present

## 2024-09-07 DIAGNOSIS — L0390 Cellulitis, unspecified: Secondary | ICD-10-CM | POA: Diagnosis not present

## 2024-09-07 DIAGNOSIS — R0681 Apnea, not elsewhere classified: Secondary | ICD-10-CM | POA: Diagnosis not present

## 2024-10-04 ENCOUNTER — Encounter: Payer: Self-pay | Admitting: Family

## 2024-10-08 NOTE — Telephone Encounter (Signed)
 No, unfortunately that is a program for patients with a chronic problem and have certain type of insurance. I encourage her to sign up at the [facility] or at our health center, [facility] and I think you sign up under a trial period. They have staff that can show her around and give instruction on the all the equipment. They cost almost the same. Thx

## 2024-10-12 ENCOUNTER — Other Ambulatory Visit (HOSPITAL_BASED_OUTPATIENT_CLINIC_OR_DEPARTMENT_OTHER): Payer: Self-pay

## 2024-10-12 MED ORDER — FLUZONE 0.5 ML IM SUSY
0.5000 mL | PREFILLED_SYRINGE | Freq: Once | INTRAMUSCULAR | 0 refills | Status: AC
  Filled 2024-10-12: qty 0.5, 1d supply, fill #0

## 2024-12-13 ENCOUNTER — Encounter: Payer: Self-pay | Admitting: Family

## 2025-01-03 ENCOUNTER — Ambulatory Visit: Admitting: Family

## 2025-01-04 ENCOUNTER — Ambulatory Visit: Admitting: Family
